# Patient Record
Sex: Female | Born: 1955 | Race: White | Hispanic: No | Marital: Married | Smoking: Former smoker
Health system: Southern US, Community
[De-identification: ages and names within clinical notes are randomized; demographics above are authoritative.]

## PROBLEM LIST (undated history)

## (undated) DIAGNOSIS — F32A Depression, unspecified: Secondary | ICD-10-CM

## (undated) DIAGNOSIS — R1909 Other intra-abdominal and pelvic swelling, mass and lump: Secondary | ICD-10-CM

## (undated) HISTORY — DX: Other intra-abdominal and pelvic swelling, mass and lump: R19.09

## (undated) HISTORY — PX: HERNIA REPAIR: SHX51

---

## 1990-08-27 HISTORY — PX: TUBAL LIGATION: SHX77

## 1992-03-27 HISTORY — PX: BREAST SURGERY: SHX581

## 2002-04-18 ENCOUNTER — Emergency Department (HOSPITAL_COMMUNITY): Admission: EM | Admit: 2002-04-18 | Discharge: 2002-04-19 | Payer: Self-pay | Admitting: *Deleted

## 2002-04-19 ENCOUNTER — Encounter: Payer: Self-pay | Admitting: Emergency Medicine

## 2008-03-07 ENCOUNTER — Encounter: Payer: Self-pay | Admitting: Family Medicine

## 2008-09-28 ENCOUNTER — Encounter (INDEPENDENT_AMBULATORY_CARE_PROVIDER_SITE_OTHER): Payer: Self-pay | Admitting: *Deleted

## 2008-10-10 ENCOUNTER — Ambulatory Visit: Payer: Self-pay | Admitting: Family Medicine

## 2008-10-10 DIAGNOSIS — F329 Major depressive disorder, single episode, unspecified: Secondary | ICD-10-CM

## 2008-10-10 DIAGNOSIS — F419 Anxiety disorder, unspecified: Secondary | ICD-10-CM

## 2008-10-10 DIAGNOSIS — M810 Age-related osteoporosis without current pathological fracture: Secondary | ICD-10-CM

## 2008-11-01 ENCOUNTER — Ambulatory Visit: Payer: Self-pay | Admitting: Family Medicine

## 2008-11-01 ENCOUNTER — Other Ambulatory Visit: Admission: RE | Admit: 2008-11-01 | Discharge: 2008-11-01 | Payer: Self-pay | Admitting: Family Medicine

## 2008-11-01 ENCOUNTER — Encounter: Payer: Self-pay | Admitting: Family Medicine

## 2008-11-01 DIAGNOSIS — R1909 Other intra-abdominal and pelvic swelling, mass and lump: Secondary | ICD-10-CM

## 2008-11-01 DIAGNOSIS — I8390 Asymptomatic varicose veins of unspecified lower extremity: Secondary | ICD-10-CM | POA: Insufficient documentation

## 2008-11-02 ENCOUNTER — Telehealth (INDEPENDENT_AMBULATORY_CARE_PROVIDER_SITE_OTHER): Payer: Self-pay | Admitting: *Deleted

## 2008-11-02 ENCOUNTER — Encounter (INDEPENDENT_AMBULATORY_CARE_PROVIDER_SITE_OTHER): Payer: Self-pay | Admitting: *Deleted

## 2008-11-02 ENCOUNTER — Encounter: Admission: RE | Admit: 2008-11-02 | Discharge: 2008-11-02 | Payer: Self-pay | Admitting: Family Medicine

## 2008-11-02 LAB — CONVERTED CEMR LAB
BUN: 7 mg/dL (ref 6–23)
Basophils Absolute: 0 10*3/uL (ref 0.0–0.1)
Bilirubin, Direct: 0 mg/dL (ref 0.0–0.3)
Cholesterol: 213 mg/dL — ABNORMAL HIGH (ref 0–200)
Creatinine, Ser: 0.8 mg/dL (ref 0.4–1.2)
Eosinophils Relative: 2.6 % (ref 0.0–5.0)
GFR calc non Af Amer: 79.96 mL/min (ref >60)
Glucose, Bld: 98 mg/dL (ref 70–99)
HCT: 40.8 % (ref 36.0–46.0)
HDL: 70.4 mg/dL (ref >39.00)
Lymphs Abs: 1.6 10*3/uL (ref 0.7–4.0)
MCV: 94.4 fL (ref 78.0–100.0)
Monocytes Absolute: 0.3 10*3/uL (ref 0.1–1.0)
Monocytes Relative: 6.6 % (ref 3.0–12.0)
Neutrophils Relative %: 58.6 % (ref 43.0–77.0)
Platelets: 229 10*3/uL (ref 150.0–400.0)
Potassium: 4 meq/L (ref 3.5–5.1)
RDW: 12.3 % (ref 11.5–14.6)
TSH: 2.88 microintl units/mL (ref 0.35–5.50)
Total Bilirubin: 0.7 mg/dL (ref 0.3–1.2)
Total CHOL/HDL Ratio: 3
VLDL: 12.8 mg/dL (ref 0.0–40.0)
WBC: 5 10*3/uL (ref 4.5–10.5)

## 2008-11-05 ENCOUNTER — Encounter (INDEPENDENT_AMBULATORY_CARE_PROVIDER_SITE_OTHER): Payer: Self-pay | Admitting: *Deleted

## 2008-11-09 ENCOUNTER — Encounter: Admission: RE | Admit: 2008-11-09 | Discharge: 2008-11-09 | Payer: Self-pay | Admitting: Family Medicine

## 2008-11-14 ENCOUNTER — Ambulatory Visit: Payer: Self-pay | Admitting: Vascular Surgery

## 2008-11-15 ENCOUNTER — Telehealth (INDEPENDENT_AMBULATORY_CARE_PROVIDER_SITE_OTHER): Payer: Self-pay | Admitting: *Deleted

## 2008-11-23 ENCOUNTER — Encounter (INDEPENDENT_AMBULATORY_CARE_PROVIDER_SITE_OTHER): Payer: Self-pay | Admitting: *Deleted

## 2008-11-26 ENCOUNTER — Ambulatory Visit: Payer: Self-pay | Admitting: Gastroenterology

## 2008-12-10 ENCOUNTER — Ambulatory Visit: Payer: Self-pay | Admitting: Gastroenterology

## 2008-12-10 LAB — HM COLONOSCOPY

## 2009-03-27 ENCOUNTER — Telehealth (INDEPENDENT_AMBULATORY_CARE_PROVIDER_SITE_OTHER): Payer: Self-pay | Admitting: *Deleted

## 2009-06-18 ENCOUNTER — Ambulatory Visit: Payer: Self-pay | Admitting: Family

## 2009-06-18 DIAGNOSIS — R233 Spontaneous ecchymoses: Secondary | ICD-10-CM

## 2009-06-18 DIAGNOSIS — M545 Low back pain: Secondary | ICD-10-CM

## 2009-06-18 LAB — CONVERTED CEMR LAB
Basophils Absolute: 0.1 10*3/uL (ref 0.0–0.1)
Eosinophils Absolute: 0.1 10*3/uL (ref 0.0–0.7)
Lymphocytes Relative: 28.1 % (ref 12.0–46.0)
MCHC: 33.9 g/dL (ref 30.0–36.0)
MCV: 97.2 fL (ref 78.0–100.0)
Monocytes Absolute: 0.4 10*3/uL (ref 0.1–1.0)
Neutrophils Relative %: 60.6 % (ref 43.0–77.0)
Platelets: 205 10*3/uL (ref 150.0–400.0)
RDW: 11.9 % (ref 11.5–14.6)

## 2009-06-21 ENCOUNTER — Encounter: Payer: Self-pay | Admitting: Family

## 2009-07-10 ENCOUNTER — Telehealth (INDEPENDENT_AMBULATORY_CARE_PROVIDER_SITE_OTHER): Payer: Self-pay | Admitting: *Deleted

## 2010-05-01 ENCOUNTER — Telehealth (INDEPENDENT_AMBULATORY_CARE_PROVIDER_SITE_OTHER): Payer: Self-pay | Admitting: *Deleted

## 2010-08-26 NOTE — Progress Notes (Signed)
Summary: BUPROPION REFILL   Phone Note Refill Request Message from:  Fax from Pharmacy on May 01, 2010 9:29 AM  Refills Requested: Medication #1:  BUPROPION HCL 150 MG XR12H-TAB take one tablet two times a day [facility] fax [phone]  Initial call taken by: Okey Regal [place],  May 01, 2010 9:30 AM    New/Updated Medications: BUPROPION HCL 150 MG XR12H-TAB (BUPROPION HCL) take one tablet two times a day- OFFICE DUE FOR FURTHER REFILLS Prescriptions: BUPROPION HCL 150 MG XR12H-TAB (BUPROPION HCL) take one tablet two times a day- OFFICE DUE FOR FURTHER REFILLS  #30 x 0   Entered by:   Doristine Devoid CMA   Authorized by:   Neena Rhymes MD   Signed by:   Doristine Devoid CMA on 05/01/2010   Method used:   Electronically to        [facility] [address] (retail)       [phone]       [place], [place]  [address]       Ph: [phone]       Fax: [phone]   RxID:   [id number]

## 2010-12-09 NOTE — Consult Note (Signed)
NEW PATIENT CONSULTATION   Proctor, Alexa L  DOB:  1955-11-11                                       11/14/2008  ZOXWR#:[id number]   Patient presents today for evaluation of a heavy sensation in both lower  extremities.  She reports that this has been somewhat progressive over  time and has occurred more so with prolonged standing and prolonged  sitting.  She describes this as an aching sensation in both legs, mostly  from her knees distally.  She is not having any history of deep venous  thrombosis or superficial thrombophlebitis.  She did report some  varicosities in her right medial proximal thigh with pregnancy but none  since.  She currently does not have any varicose veins.  She elevates  her legs when possible and does get some relief from this.   Past medical history is significant for osteoporosis and depression.   She has a history of hypertension in her father.   SOCIAL HISTORY:  She is married with 2 children.  She works as a  Futures trader.  She does smoke less than 1 pack of cigarettes per day.  Does  not drink alcohol on a regular basis.   REVIEW OF SYSTEMS:  Her weight is 103 pounds.  She is 5 feet 2 inches  tall.   MEDICATIONS:  Wellbutrin.   ALLERGIES:  Sulfa.   PHYSICAL EXAMINATION:  Blood pressure is 97/68.  Heart rate 66.  Respirations 18.  A well-developed, thin white female in no acute  distress.  Her radial and dorsalis pedis pulses are 2+ bilaterally.  She  does have mottling on her legs bilaterally in a cool room.  She does  have spider vein telangiectasias on her ankles onto her foot.  She does  not have any varicose veins.   She underwent screening venous duplex by me, and this does show a normal-  sized saphenous vein with no evidence of valvular incompetence.  I  discussed this at length with the patient.  I do not feel that she has  any evidence of arterial or venous pathology that would explain her  discomfort.  I explained  the spider vein telangiectasia could be treated  for cosmetic concerns with sclerotherapy if she desired, which she does  not.  She was reassured with this discussion and will see [place] again on an  as-needed basis.   Larina Earthly, M.D.  Electronically Signed   TFE/MEDQ  D:  11/14/2008  T:  11/15/2008  Job:  [id number]   cc:   Neena Rhymes, M.D.

## 2011-02-20 ENCOUNTER — Encounter: Payer: Self-pay | Admitting: Family Medicine

## 2011-02-20 ENCOUNTER — Ambulatory Visit (INDEPENDENT_AMBULATORY_CARE_PROVIDER_SITE_OTHER): Payer: BC Managed Care – PPO | Admitting: Family Medicine

## 2011-02-20 VITALS — BP 94/60 | Temp 99.6°F | Wt 99.0 lb

## 2011-02-20 DIAGNOSIS — M545 Low back pain: Secondary | ICD-10-CM

## 2011-02-20 MED ORDER — DICLOFENAC SODIUM ER 100 MG PO TB24: 100.0000 mg | ORAL_TABLET | Freq: Every day | ORAL | Status: DC

## 2011-02-20 MED ORDER — CYCLOBENZAPRINE HCL 10 MG PO TABS: 10.0000 mg | ORAL_TABLET | Freq: Three times a day (TID) | ORAL | Status: DC | PRN

## 2011-02-20 NOTE — Progress Notes (Signed)
  Subjective:    Patient ID: Alexa Proctor, female    DOB: [birth year], 55 y.o.   MRN: [id number]  HPI Back pain- sxs started prior to Christmas.  Pain is just left of thoracic spine.  Will radiate around the sides and 'through me' and cause nausea when pain gets severe.  Pain improved w/ 2 weeks of rest.  Once she resumed sewing 'hunching over the sewing table' pain returned.  Over the last 2 months pain has increased and it takes less activity to cause pain.  Pain improves w/ tylenol/advil/sleep.   Review of Systems For ROS see HPI     Objective:   Physical Exam  Constitutional: She is oriented to person, place, and time. She appears well-developed and well-nourished. No distress.  Neck: Normal range of motion. Neck supple.  Musculoskeletal: Normal range of motion. She exhibits tenderness (thoracic paraspinal tenderness on L). She exhibits no edema.  Neurological: She is alert and oriented to person, place, and time. She has normal reflexes. No cranial nerve deficit. Coordination normal.          Assessment & Plan:   No problem-specific assessment & plan notes found for this encounter.

## 2011-02-20 NOTE — Patient Instructions (Signed)
Please schedule your complete physical at your convenience- do not eat before this appt Go to the [facility] on [address] to get your xray- we'll call you with your results Someone will call you with your physical therapy appt Start the Flexeril nightly as needed for pain Take the Voltaren regularly for the next 10 days- take w/ food Call with any questions or concerns Hang in there!

## 2011-02-22 NOTE — Assessment & Plan Note (Signed)
Pt's back pain is now thoracic but it is muscular in nature.  No TTP over spine, + paraspinal spasm on L.  Start scheduled NSAIDs, muscle relaxer prn, refer to PT for core and back strengthening.  Reviewed supportive care and red flags that should prompt return.  Pt expressed understanding and is in agreement w/ plan.

## 2011-02-23 ENCOUNTER — Ambulatory Visit (HOSPITAL_BASED_OUTPATIENT_CLINIC_OR_DEPARTMENT_OTHER)
Admission: RE | Admit: 2011-02-23 | Discharge: 2011-02-23 | Disposition: A | Discharge status: Home / Self Care | Payer: BC Managed Care – PPO | Source: Ambulatory Visit | Attending: Family Medicine | Admitting: Family Medicine

## 2011-02-23 DIAGNOSIS — M545 Low back pain: Secondary | ICD-10-CM

## 2011-02-23 DIAGNOSIS — M546 Pain in thoracic spine: Secondary | ICD-10-CM | POA: Insufficient documentation

## 2011-02-24 ENCOUNTER — Telehealth: Payer: Self-pay

## 2011-02-24 NOTE — Telephone Encounter (Signed)
Results mailed 

## 2011-02-24 NOTE — Telephone Encounter (Signed)
Message copied by Beverely Low on Tue Feb 24, 2011  8:26 AM ------      Message from: Sheliah Hatch      Created: Tue Feb 24, 2011  7:58 AM       Normal xray

## 2011-04-13 ENCOUNTER — Ambulatory Visit (INDEPENDENT_AMBULATORY_CARE_PROVIDER_SITE_OTHER): Payer: BC Managed Care – PPO | Admitting: Family Medicine

## 2011-04-13 ENCOUNTER — Encounter: Payer: Self-pay | Admitting: Family Medicine

## 2011-04-13 ENCOUNTER — Other Ambulatory Visit (HOSPITAL_COMMUNITY)
Admission: RE | Admit: 2011-04-13 | Discharge: 2011-04-13 | Disposition: A | Discharge status: Home / Self Care | Payer: BC Managed Care – PPO | Source: Ambulatory Visit | Attending: Family Medicine | Admitting: Family Medicine

## 2011-04-13 DIAGNOSIS — G2581 Restless legs syndrome: Secondary | ICD-10-CM | POA: Insufficient documentation

## 2011-04-13 DIAGNOSIS — Z01419 Encounter for gynecological examination (general) (routine) without abnormal findings: Secondary | ICD-10-CM | POA: Insufficient documentation

## 2011-04-13 DIAGNOSIS — Z1231 Encounter for screening mammogram for malignant neoplasm of breast: Secondary | ICD-10-CM | POA: Insufficient documentation

## 2011-04-13 DIAGNOSIS — M810 Age-related osteoporosis without current pathological fracture: Secondary | ICD-10-CM

## 2011-04-13 DIAGNOSIS — Z124 Encounter for screening for malignant neoplasm of cervix: Secondary | ICD-10-CM

## 2011-04-13 DIAGNOSIS — F329 Major depressive disorder, single episode, unspecified: Secondary | ICD-10-CM

## 2011-04-13 DIAGNOSIS — Z0000 Encounter for general adult medical examination without abnormal findings: Secondary | ICD-10-CM | POA: Insufficient documentation

## 2011-04-13 LAB — HEPATIC FUNCTION PANEL: Total Bilirubin: 0.8 mg/dL (ref 0.3–1.2)

## 2011-04-13 LAB — LIPID PANEL
Cholesterol: 189 mg/dL (ref 0–200)
HDL: 76.1 mg/dL (ref >39.00)
Triglycerides: 28 mg/dL (ref 0.0–149.0)
VLDL: 5.6 mg/dL (ref 0.0–40.0)

## 2011-04-13 LAB — CBC WITH DIFFERENTIAL/PLATELET
Basophils Relative: 1 % (ref 0.0–3.0)
Eosinophils Absolute: 0.1 10*3/uL (ref 0.0–0.7)
Hemoglobin: 14.4 g/dL (ref 12.0–15.0)
MCHC: 33.4 g/dL (ref 30.0–36.0)
MCV: 96.5 fl (ref 78.0–100.0)
Monocytes Absolute: 0.3 10*3/uL (ref 0.1–1.0)
Neutro Abs: 4.4 10*3/uL (ref 1.4–7.7)
RBC: 4.49 Mil/uL (ref 3.87–5.11)
RDW: 13.2 % (ref 11.5–14.6)

## 2011-04-13 LAB — BASIC METABOLIC PANEL
BUN: 9 mg/dL (ref 6–23)
Calcium: 9.1 mg/dL (ref 8.4–10.5)
Creatinine, Ser: 0.7 mg/dL (ref 0.4–1.2)
GFR: 92.42 mL/min (ref >60.00)

## 2011-04-13 LAB — TSH: TSH: 2.69 u[IU]/mL (ref 0.35–5.50)

## 2011-04-13 MED ORDER — BUPROPION HCL ER (XL) 150 MG PO TB24: 150.0000 mg | ORAL_TABLET | ORAL | Status: DC

## 2011-04-13 MED ORDER — CLONAZEPAM 1 MG PO TABS: 1.0000 mg | ORAL_TABLET | Freq: Every evening | ORAL | Status: DC | PRN

## 2011-04-13 NOTE — Progress Notes (Signed)
  Subjective:    Patient ID: Alexa Proctor, female    DOB: 01/27/1956, 55 y.o.   MRN: [id number]  HPI CPE- UTD on colonoscopy.  Due for DEXA, PAP, mammo.  ? Restless leg- reports she is unable to get comfortable at night.  Has urge to move legs, feels like the muscles are 'strained', 'unable to relax', w/ 'burning'.  sxs will come and go.   Review of Systems Patient reports no vision/ hearing changes, adenopathy,fever, weight change,  persistant/recurrent hoarseness , swallowing issues, chest pain, palpitations, edema, persistant/recurrent cough, hemoptysis, dyspnea (rest/exertional/paroxysmal nocturnal), gastrointestinal bleeding (melena, rectal bleeding), abdominal pain, significant heartburn, bowel changes, GU symptoms (dysuria, hematuria, incontinence), Gyn symptoms (abnormal  bleeding, pain),  syncope, focal weakness, memory loss, numbness & tingling, skin/hair/nail changes, abnormal bruising or bleeding, anxiety, or depression.     Objective:   Physical Exam  General Appearance:    Alert, cooperative, no distress, appears stated age  Head:    Normocephalic, without obvious abnormality, atraumatic  Eyes:    PERRL, conjunctiva/corneas clear, EOM's intact, fundi    benign, both eyes  Ears:    Normal TM's and external ear canals, both ears  Nose:   Nares normal, septum midline, mucosa normal, no drainage    or sinus tenderness  Throat:   Lips, mucosa, and tongue normal; teeth and gums normal  Neck:   Supple, symmetrical, trachea midline, no adenopathy;    Thyroid: no enlargement/tenderness/nodules  Back:     Symmetric, no curvature, ROM normal, no CVA tenderness  Lungs:     Clear to auscultation bilaterally, respirations unlabored  Chest Wall:    No tenderness or deformity   Heart:    Regular rate and rhythm, S1 and S2 normal, no murmur, rub   or gallop  Breast Exam:    No tenderness, masses, or nipple abnormality  Abdomen:     Soft, non-tender, bowel sounds active all four  quadrants,    no masses, no organomegaly  Genitalia:    External genitalia normal, cervix normal in appearance, no CMT, uterus in normal size and position, adnexa w/out mass or tenderness, mucosa pink and moist, no lesions or discharge present  Rectal:    Normal external appearance  Extremities:   Extremities normal, atraumatic, no cyanosis or edema  Pulses:   2+ and symmetric all extremities  Skin:   Skin color, texture, turgor normal, no rashes or lesions  Lymph nodes:   Cervical, supraclavicular, and axillary nodes normal  Neurologic:   CNII-XII intact, normal strength, sensation and reflexes    throughout          Assessment & Plan:

## 2011-04-13 NOTE — Patient Instructions (Signed)
Follow up in 1 month to recheck mood and leg symptoms We'll notify you of your lab results Your exam looks great!  Keep up the good work! STOP SMOKING! Call with any questions or concerns Hang in there!

## 2011-04-14 ENCOUNTER — Telehealth: Payer: Self-pay

## 2011-04-14 ENCOUNTER — Encounter: Payer: Self-pay | Admitting: Family Medicine

## 2011-04-14 NOTE — Assessment & Plan Note (Signed)
Pt's description of sxs is suspicious for RLS- start low dose Klonopin nightly and see if sxs improve.

## 2011-04-14 NOTE — Assessment & Plan Note (Signed)
Has not taken meds as previously prescribed.  Due for DEXA- referral made

## 2011-04-14 NOTE — Assessment & Plan Note (Signed)
Pt's PE WNL.  Check labs.  Get mammo and dexa.  UTD on colonoscopy.  Encouraged smoking cessation.  Check labs.  Anticipatory guidance provided.  EKG done as baseline- short PR interval but nothing acute.

## 2011-04-14 NOTE — Assessment & Plan Note (Signed)
Pt due for mammo- referral made

## 2011-04-14 NOTE — Telephone Encounter (Signed)
Labs mailed

## 2011-04-14 NOTE — Assessment & Plan Note (Signed)
Pap collected. 

## 2011-04-14 NOTE — Telephone Encounter (Signed)
Message copied by Beverely Low on Tue Apr 14, 2011  3:32 PM ------      Message from: Sheliah Hatch      Created: Tue Apr 14, 2011  8:04 AM       Labs look great!  Keep up the good work!

## 2011-04-14 NOTE — Assessment & Plan Note (Signed)
Would like to restart Wellbutrin to help w/ both depression and possible smoking cessation.  Will follow.

## 2011-04-15 LAB — VITAMIN D 1,25 DIHYDROXY: Vitamin D3 1, 25 (OH)2: 49 pg/mL

## 2011-04-15 NOTE — Progress Notes (Signed)
Quick Note:  Lab mailed ______ 

## 2011-04-28 ENCOUNTER — Ambulatory Visit
Admission: RE | Admit: 2011-04-28 | Discharge: 2011-04-28 | Disposition: A | Discharge status: Home / Self Care | Payer: BC Managed Care – PPO | Source: Ambulatory Visit | Attending: Family Medicine | Admitting: Family Medicine

## 2011-04-28 DIAGNOSIS — Z1231 Encounter for screening mammogram for malignant neoplasm of breast: Secondary | ICD-10-CM

## 2011-04-28 DIAGNOSIS — M810 Age-related osteoporosis without current pathological fracture: Secondary | ICD-10-CM

## 2011-04-29 ENCOUNTER — Telehealth: Payer: Self-pay

## 2011-04-29 MED ORDER — ALENDRONATE SODIUM 70 MG PO TABS: 70.0000 mg | ORAL_TABLET | ORAL | Status: DC

## 2011-04-29 NOTE — Telephone Encounter (Signed)
Message copied by Beverely Low on Wed Apr 29, 2011  5:03 PM ------      Message from: Sheliah Hatch      Created: Wed Apr 29, 2011  9:31 AM       Remains osteoporotic.  Really needs to start a medication to protect her bones.  Would recommend Fosamax 75mg  weekly in addition to Calcium and Vit D

## 2011-04-29 NOTE — Telephone Encounter (Signed)
Pt aware and Rx sent.  

## 2011-05-05 ENCOUNTER — Encounter: Payer: Self-pay | Admitting: Family Medicine

## 2011-05-14 ENCOUNTER — Ambulatory Visit: Payer: BC Managed Care – PPO | Admitting: Family Medicine

## 2011-05-15 ENCOUNTER — Encounter: Payer: Self-pay | Admitting: Family Medicine

## 2011-05-15 ENCOUNTER — Ambulatory Visit (INDEPENDENT_AMBULATORY_CARE_PROVIDER_SITE_OTHER): Payer: BC Managed Care – PPO | Admitting: Family Medicine

## 2011-05-15 VITALS — BP 115/65 | HR 72 | Temp 98.9°F | Ht 62.75 in | Wt 97.0 lb

## 2011-05-15 DIAGNOSIS — F329 Major depressive disorder, single episode, unspecified: Secondary | ICD-10-CM

## 2011-05-15 DIAGNOSIS — F3289 Other specified depressive episodes: Secondary | ICD-10-CM

## 2011-05-15 DIAGNOSIS — G2581 Restless legs syndrome: Secondary | ICD-10-CM

## 2011-05-15 MED ORDER — CLONAZEPAM 1 MG PO TABS: 1.0000 mg | ORAL_TABLET | Freq: Every evening | ORAL | Status: DC | PRN

## 2011-05-15 NOTE — Progress Notes (Signed)
  Subjective:    Patient ID: Alexa Proctor, female    DOB: 1955/09/17, 55 y.o.   MRN: [id number]  HPI RLS- reports sxs have completely resolved since starting Klonopin.  Is now taking 1/2 tabs b/c of AM hangover.  Sleeping great.  Taking nightly.  Depression- has not restarted the Wellbutrin b/c she felt sxs were improving as her sleep improved.   Review of Systems For ROS see HPI     Objective:   Physical Exam  Vitals reviewed. Constitutional: She appears well-developed and well-nourished. No distress.  Psychiatric: She has a normal mood and affect. Her behavior is normal. Judgment and thought content normal.          Assessment & Plan:

## 2011-05-15 NOTE — Patient Instructions (Signed)
Continue the Klonopin nightly Hold on the Wellbutrin- you may not need it since your sleep has improved so much! Call with any questions or concerns Keep up the good work!!

## 2011-05-17 NOTE — Assessment & Plan Note (Signed)
sxs have completely resolved since starting Klonopin.  Will continue meds.  Pt to take 1/2 tab most nights but has whole tab available as needed.  Pt expressed understanding and is in agreement w/ plan.

## 2011-05-17 NOTE — Assessment & Plan Note (Signed)
Pt reports sxs have dramatically improved since sleep has improved.  Has not restarted Wellbutrin.  Will hold on this for now.

## 2011-11-30 ENCOUNTER — Ambulatory Visit (INDEPENDENT_AMBULATORY_CARE_PROVIDER_SITE_OTHER): Payer: BC Managed Care – PPO | Admitting: *Deleted

## 2011-11-30 ENCOUNTER — Other Ambulatory Visit: Payer: Self-pay | Admitting: Family Medicine

## 2011-11-30 DIAGNOSIS — Z23 Encounter for immunization: Secondary | ICD-10-CM

## 2011-11-30 DIAGNOSIS — F3289 Other specified depressive episodes: Secondary | ICD-10-CM

## 2011-11-30 DIAGNOSIS — F329 Major depressive disorder, single episode, unspecified: Secondary | ICD-10-CM

## 2011-11-30 DIAGNOSIS — G2581 Restless legs syndrome: Secondary | ICD-10-CM

## 2011-11-30 NOTE — Telephone Encounter (Signed)
refill bupropion HCL XL 150mg  tablet Qty 30 Take one tablet by mouth every morning Last filled 4.5.13  Last OV 10.19.12  &   Clonazepam 1MG  Tablet  Take one tablet at bedtime as needed for Anxiety

## 2011-12-01 MED ORDER — CLONAZEPAM 1 MG PO TABS: 1.0000 mg | ORAL_TABLET | Freq: Every evening | ORAL | Status: DC | PRN

## 2011-12-01 MED ORDER — BUPROPION HCL ER (XL) 150 MG PO TB24: 150.0000 mg | ORAL_TABLET | ORAL | Status: DC

## 2011-12-01 NOTE — Telephone Encounter (Signed)
Ok for #30 w/ 3 on each

## 2011-12-01 NOTE — Telephone Encounter (Signed)
rx sent to pharmacy by e-script for wellbutrin and manually faxed the Klonopin

## 2011-12-01 NOTE — Telephone Encounter (Signed)
Last OV and refill 05-15-11 #30 with 3 refills for Klonopin , refill for Wellbutrin on 04-13-11 #30 with 2 refills

## 2011-12-02 LAB — TB SKIN TEST
Induration: 0
TB Skin Test: NEGATIVE mm

## 2011-12-31 ENCOUNTER — Ambulatory Visit (INDEPENDENT_AMBULATORY_CARE_PROVIDER_SITE_OTHER): Payer: BC Managed Care – PPO | Admitting: Family Medicine

## 2011-12-31 ENCOUNTER — Encounter: Payer: Self-pay | Admitting: Family Medicine

## 2011-12-31 VITALS — BP 94/68 | HR 71 | Temp 99.1°F | Wt 93.6 lb

## 2011-12-31 DIAGNOSIS — R1909 Other intra-abdominal and pelvic swelling, mass and lump: Secondary | ICD-10-CM

## 2011-12-31 NOTE — Assessment & Plan Note (Addendum)
Enlarging.  Will refer to general surgery based on enlargement and pain.  Pt to take tylenol/NSAIDs prn for pain, heating pad prn.  Reviewed supportive care and red flags that should prompt return.  Pt expressed understanding and is in agreement w/ plan.

## 2011-12-31 NOTE — Progress Notes (Signed)
  Subjective:    Patient ID: Alexa Proctor, female    DOB: 1955-09-20, 56 y.o.   MRN: [id number]  HPI R sided groin cyst- has had 2 sets of imaging confirming a cyst but since Christmas has had 3-4 episodes of feeling that it was enlarged, firm, tight.  Now having intermittent pain described as a 'throbbing'.  No drainage from the area.  Present for at least 10 yrs.  Reports area is slowly enlarging.   Review of Systems For ROS see HPI     Objective:   Physical Exam  Vitals reviewed. Constitutional: She appears well-developed and well-nourished. No distress.  Musculoskeletal:       Right hip: She exhibits tenderness and swelling.       Legs: Lymphadenopathy:       Right: No inguinal adenopathy present.       Left: No inguinal adenopathy present.  Skin: Skin is warm and dry.          Assessment & Plan:

## 2011-12-31 NOTE — Patient Instructions (Signed)
We'll call you with your surgery appt Take Aleve and use a heating pad when this flares Call with any questions or concerns Hang in there!!!

## 2012-01-26 ENCOUNTER — Ambulatory Visit (INDEPENDENT_AMBULATORY_CARE_PROVIDER_SITE_OTHER): Payer: BC Managed Care – PPO | Admitting: Surgery

## 2012-01-26 ENCOUNTER — Encounter (INDEPENDENT_AMBULATORY_CARE_PROVIDER_SITE_OTHER): Payer: Self-pay | Admitting: Surgery

## 2012-01-26 VITALS — BP 102/64 | HR 60 | Temp 96.8°F | Resp 14 | Ht 63.0 in | Wt 94.0 lb

## 2012-01-26 DIAGNOSIS — R1909 Other intra-abdominal and pelvic swelling, mass and lump: Secondary | ICD-10-CM

## 2012-01-26 NOTE — Progress Notes (Signed)
Patient ID: Alexa Proctor, female   DOB: Jan 11, 1956, 56 y.o.   MRN: [id number]  Chief Complaint  Patient presents with  . Mass    Groin    HPI Alexa Proctor is a 56 y.o. female.   HPIPatient sent at the request of Dr.Tabori do to  Mass in right groin. It has been present for roughly 7 years. It is getting larger and causing mild to moderate discomfort. Ultrasound was done in 2010 which showed a simple cyst. She would like to have this removed.  Past Medical History  Diagnosis Date  . Osteoporosis   . Groin mass in female     Past Surgical History  Procedure Date  . Tubal ligation 08/1990  . Breast surgery 03/1992    implants    Family History  Problem Relation Age of Onset  . Cancer Mother     breast  . Hypertension Father   . Cancer Father     pancreatic  . Cancer Sister     colon, lung    Social History History  Substance Use Topics  . Smoking status: Current Everyday Smoker -- 0.5 packs/day    Types: Cigarettes  . Smokeless tobacco: Not on file  . Alcohol Use: Yes    Allergies  Allergen Reactions  . Soma (Carisoprodol)     Made patient feel "loopy, weird"  . Sulfonamide Derivatives     Per patient happened in childhood, does not know reaction.    Current Outpatient Prescriptions  Medication Sig Dispense Refill  . buPROPion (WELLBUTRIN XL) 150 MG 24 hr tablet Take 150 mg by mouth daily.      . clonazePAM (KLONOPIN) 1 MG tablet Take 1 tablet (1 mg total) by mouth at bedtime as needed for anxiety.  30 tablet  3    Review of Systems Review of Systems  Constitutional: Negative.   HENT: Negative.   Eyes: Negative.   Respiratory: Negative.   Cardiovascular: Negative.   Gastrointestinal: Negative.   Musculoskeletal: Negative.   Neurological: Negative.   Psychiatric/Behavioral: Negative.     Blood pressure 102/64, pulse 60, temperature 96.8 F (36 C), temperature source Temporal, resp. rate 14, height 5\' 3"  (1.6 m), weight 94 lb (42.638  kg).  Physical Exam Physical Exam  Constitutional: She is oriented to person, place, and time. She appears well-developed and well-nourished.  HENT:  Head: Normocephalic and atraumatic.  Eyes: EOM are normal. Pupils are equal, round, and reactive to light.  Neck: Normal range of motion. Neck supple.  Cardiovascular: Normal rate and regular rhythm.   Pulmonary/Chest: Effort normal and breath sounds normal.  Abdominal: Soft. Bowel sounds are normal.    Neurological: She is alert and oriented to person, place, and time.  Skin: Skin is warm and dry.  Psychiatric: She has a normal mood and affect. Her behavior is normal. Judgment and thought content normal.    Data Reviewed U/S   Right groin 2010  Assessment    Right groin mass    Plan    Recommend excision.  Risks,  Benefits,  And alternative   Therapy discussed.  This could also be a hernia and require repair.The risk of hernia repair include bleeding,  Infection,   Recurrence of the hernia,  Mesh use, chronic pain,  Organ injury,  Bowel injury,  Bladder injury,   nerve injury with numbness around the incision,  Death,  and worsening of preexisting  medical problems.  The alternatives to surgery have been discussed as well.Marland Kitchen  Long term expectations of both operative and non operative treatments have been discussed.   The patient agrees to proceed.       Twilla Khouri A. 01/26/2012, 3:22 PM

## 2012-01-26 NOTE — Patient Instructions (Signed)
You will be scheduled for right groin exploration and resection of cyst or repair of right inguinal hernia.

## 2012-03-21 ENCOUNTER — Other Ambulatory Visit (INDEPENDENT_AMBULATORY_CARE_PROVIDER_SITE_OTHER): Payer: Self-pay | Admitting: Surgery

## 2012-03-21 ENCOUNTER — Ambulatory Visit
Admission: RE | Admit: 2012-03-21 | Discharge: 2012-03-21 | Disposition: A | Discharge status: Home / Self Care | Payer: BC Managed Care – PPO | Source: Ambulatory Visit | Attending: Surgery | Admitting: Surgery

## 2012-03-21 DIAGNOSIS — Z01811 Encounter for preprocedural respiratory examination: Secondary | ICD-10-CM

## 2012-03-21 DIAGNOSIS — R1909 Other intra-abdominal and pelvic swelling, mass and lump: Secondary | ICD-10-CM

## 2012-03-23 ENCOUNTER — Other Ambulatory Visit: Payer: Self-pay | Admitting: Family Medicine

## 2012-03-23 NOTE — Telephone Encounter (Signed)
Called pt to clarify if she is still taking the wellbutrin per notation on last OV to follow up on wellbutrin noted 10-12 to use if need per klonopin may help instead, pt noted she is still taking, please send refill, sent via escribe

## 2012-03-24 DIAGNOSIS — IMO0001 Reserved for inherently not codable concepts without codable children: Secondary | ICD-10-CM

## 2012-03-24 HISTORY — PX: OTHER SURGICAL HISTORY: SHX169

## 2012-04-12 ENCOUNTER — Ambulatory Visit (INDEPENDENT_AMBULATORY_CARE_PROVIDER_SITE_OTHER): Payer: BC Managed Care – PPO | Admitting: Surgery

## 2012-04-12 ENCOUNTER — Encounter (INDEPENDENT_AMBULATORY_CARE_PROVIDER_SITE_OTHER): Payer: Self-pay | Admitting: Surgery

## 2012-04-12 VITALS — BP 106/72 | HR 72 | Temp 98.2°F | Ht 63.25 in | Wt 90.8 lb

## 2012-04-12 DIAGNOSIS — N898 Other specified noninflammatory disorders of vagina: Secondary | ICD-10-CM

## 2012-04-12 DIAGNOSIS — Z9889 Other specified postprocedural states: Secondary | ICD-10-CM

## 2012-04-12 MED ORDER — FLUCONAZOLE 100 MG PO TABS: 100.0000 mg | ORAL_TABLET | Freq: Every day | ORAL | Status: AC

## 2012-04-12 NOTE — Progress Notes (Signed)
Patient returns after repair right femoral hernia with mesh. She's doing fairly well but has intermittent whitish yellowish vaginal discharge. She's having minimal pelvic pain. There is no bloody discharge or significant pain.  Exam: Right groin incision well-healed without signs of infection or recurrence.  Vagina examined with lubrication and a chaperone in the room digitally. No evidence of blood or vaginal injury. No evidence of discharge.  Impression: Status post repair of right femoral hernia with postop vaginal discharge  Plan: Try Diflucan for 7 days 100 mg p.o. daily. If no better she will need to see her gynecologist. I can feel no complicating feature on exam to explain her discharge. Return as needed.

## 2012-04-12 NOTE — Patient Instructions (Signed)
Return if symptoms worsen.  If discharge no better,  Contact GYN.  Will try diflucan for discharge.

## 2012-06-07 ENCOUNTER — Other Ambulatory Visit: Payer: Self-pay | Admitting: Family Medicine

## 2012-06-07 DIAGNOSIS — G2581 Restless legs syndrome: Secondary | ICD-10-CM

## 2012-06-07 NOTE — Telephone Encounter (Signed)
Ok for #30, 3 refills 

## 2012-06-07 NOTE — Telephone Encounter (Signed)
Last OV 12-31-11, last filled 12-01-11 #30 3

## 2012-06-07 NOTE — Telephone Encounter (Signed)
refill ClonazePAM (Tab) 1 MG Take 1 tablet (1 mg total) by mouth at bedtime as needed for anxiety. #30 wt/3-refills last fill 9.5.13---last ov  6.6.13 acute

## 2012-06-08 MED ORDER — CLONAZEPAM 1 MG PO TABS: 1.0000 mg | ORAL_TABLET | Freq: Every evening | ORAL | Status: DC | PRN

## 2012-06-08 NOTE — Telephone Encounter (Signed)
Rx sent 

## 2012-06-14 ENCOUNTER — Telehealth (INDEPENDENT_AMBULATORY_CARE_PROVIDER_SITE_OTHER): Payer: Self-pay

## 2012-06-14 NOTE — Telephone Encounter (Signed)
Pt called with questions re: anesthesia charges from the [facility] and [facility]. I advised pt to contact [facility] because we will not have any information re: [facility] billing for anesthesia or there Control and instrumentation engineer. Pt states she understands and will call their billing office.

## 2012-08-03 ENCOUNTER — Ambulatory Visit (INDEPENDENT_AMBULATORY_CARE_PROVIDER_SITE_OTHER): Payer: BC Managed Care – PPO | Admitting: Family Medicine

## 2012-08-03 ENCOUNTER — Encounter: Payer: Self-pay | Admitting: Lab

## 2012-08-03 ENCOUNTER — Encounter: Payer: Self-pay | Admitting: Family Medicine

## 2012-08-03 VITALS — BP 96/70 | HR 67 | Temp 98.1°F | Ht 63.25 in | Wt 92.4 lb

## 2012-08-03 DIAGNOSIS — J329 Chronic sinusitis, unspecified: Secondary | ICD-10-CM | POA: Insufficient documentation

## 2012-08-03 MED ORDER — AMOXICILLIN 875 MG PO TABS: 875.0000 mg | ORAL_TABLET | Freq: Two times a day (BID) | ORAL | Status: DC

## 2012-08-03 NOTE — Progress Notes (Signed)
  Subjective:    Patient ID: Alexa Proctor, female    DOB: 1956-04-21, 57 y.o.   MRN: [id number]  HPI ? Sinus infection- pt used to get regular sinus infxns, has not had one recently.  sxs started 12/26.  Has been using decongestants, antihistamines, expectorants.  + facial pain/pressure.  Mild cough- loose due to expectorant use.  No ear pain.  + PND.  Initially had low grade temps x3 days.  Tm 100.6.  No known sick contacts.   Review of Systems For ROS see HPI     Objective:   Physical Exam  Vitals reviewed. Constitutional: She appears well-developed and well-nourished. No distress.  HENT:  Head: Normocephalic and atraumatic.  Right Ear: Tympanic membrane normal.  Left Ear: Tympanic membrane normal.  Nose: Mucosal edema and rhinorrhea present. Right sinus exhibits maxillary sinus tenderness and frontal sinus tenderness. Left sinus exhibits maxillary sinus tenderness and frontal sinus tenderness.  Mouth/Throat: Uvula is midline and mucous membranes are normal. Posterior oropharyngeal erythema present. No oropharyngeal exudate.  Eyes: Conjunctivae normal and EOM are normal. Pupils are equal, round, and reactive to light.  Neck: Normal range of motion. Neck supple.  Cardiovascular: Normal rate, regular rhythm and normal heart sounds.   Pulmonary/Chest: Effort normal and breath sounds normal. No respiratory distress. She has no wheezes.  Lymphadenopathy:    She has no cervical adenopathy.          Assessment & Plan:

## 2012-08-03 NOTE — Patient Instructions (Addendum)
Start the amoxicillin twice daily- take w/ food Drink plenty of fluids Mucinex DM for cough and congestion REST! Call with any questions or concerns Hang in there!!!

## 2012-08-04 NOTE — Assessment & Plan Note (Signed)
New.  Start abx.  Reviewed supportive care and red flags that should prompt return.  Pt expressed understanding and is in agreement w/ plan.  

## 2012-09-30 ENCOUNTER — Other Ambulatory Visit: Payer: Self-pay | Admitting: Family Medicine

## 2012-10-03 NOTE — Telephone Encounter (Signed)
Please advise on RF request.  No recent CPE and last RF:08-29-12.//AB/CMA

## 2012-10-04 NOTE — Telephone Encounter (Signed)
Ok for #30, 5 refills but please remind her to schedule CPE (she's overdue)

## 2012-10-05 ENCOUNTER — Other Ambulatory Visit: Payer: Self-pay | Admitting: *Deleted

## 2012-10-05 DIAGNOSIS — F329 Major depressive disorder, single episode, unspecified: Secondary | ICD-10-CM

## 2012-10-05 MED ORDER — BUPROPION HCL ER (XL) 150 MG PO TB24: 150.0000 mg | ORAL_TABLET | Freq: Every day | ORAL | Status: DC

## 2012-10-05 NOTE — Telephone Encounter (Signed)
Refill for Wellbutrin sent to [facility] in [place] pt notified.

## 2012-12-14 ENCOUNTER — Encounter: Payer: Self-pay | Admitting: Lab

## 2012-12-15 ENCOUNTER — Encounter: Payer: Self-pay | Admitting: Family Medicine

## 2012-12-15 ENCOUNTER — Ambulatory Visit (INDEPENDENT_AMBULATORY_CARE_PROVIDER_SITE_OTHER): Payer: BC Managed Care – PPO | Admitting: Family Medicine

## 2012-12-15 VITALS — BP 90/60 | HR 68 | Temp 98.3°F | Ht 63.25 in | Wt 95.0 lb

## 2012-12-15 DIAGNOSIS — Z01419 Encounter for gynecological examination (general) (routine) without abnormal findings: Secondary | ICD-10-CM

## 2012-12-15 DIAGNOSIS — G2581 Restless legs syndrome: Secondary | ICD-10-CM

## 2012-12-15 LAB — HEPATIC FUNCTION PANEL
ALT: 35 U/L (ref 0–35)
AST: 33 U/L (ref 0–37)
Albumin: 3.9 g/dL (ref 3.5–5.2)
Alkaline Phosphatase: 59 U/L (ref 39–117)
Bilirubin, Direct: 0.1 mg/dL (ref 0.0–0.3)
Total Bilirubin: 0.7 mg/dL (ref 0.3–1.2)
Total Protein: 6.7 g/dL (ref 6.0–8.3)

## 2012-12-15 LAB — BASIC METABOLIC PANEL
BUN: 9 mg/dL (ref 6–23)
CO2: 29 mEq/L (ref 19–32)
Calcium: 9 mg/dL (ref 8.4–10.5)
Chloride: 104 mEq/L (ref 96–112)
Creatinine, Ser: 0.8 mg/dL (ref 0.4–1.2)
GFR: 77.62 mL/min (ref >60.00)
Glucose, Bld: 99 mg/dL (ref 70–99)
Potassium: 3.9 mEq/L (ref 3.5–5.1)
Sodium: 139 mEq/L (ref 135–145)

## 2012-12-15 LAB — CBC WITH DIFFERENTIAL/PLATELET
Basophils Absolute: 0 10*3/uL (ref 0.0–0.1)
Basophils Relative: 0.4 % (ref 0.0–3.0)
Eosinophils Absolute: 0.1 10*3/uL (ref 0.0–0.7)
Eosinophils Relative: 1.2 % (ref 0.0–5.0)
HCT: 41.8 % (ref 36.0–46.0)
Hemoglobin: 14.3 g/dL (ref 12.0–15.0)
Lymphocytes Relative: 16.6 % (ref 12.0–46.0)
Lymphs Abs: 1.4 10*3/uL (ref 0.7–4.0)
MCHC: 34.2 g/dL (ref 30.0–36.0)
MCV: 94.6 fl (ref 78.0–100.0)
Monocytes Absolute: 0.4 10*3/uL (ref 0.1–1.0)
Monocytes Relative: 5 % (ref 3.0–12.0)
Neutro Abs: 6.5 10*3/uL (ref 1.4–7.7)
Neutrophils Relative %: 76.8 % (ref 43.0–77.0)
Platelets: 217 10*3/uL (ref 150.0–400.0)
RBC: 4.42 Mil/uL (ref 3.87–5.11)
RDW: 12.6 % (ref 11.5–14.6)
WBC: 8.5 10*3/uL (ref 4.5–10.5)

## 2012-12-15 LAB — LIPID PANEL
Cholesterol: 180 mg/dL (ref 0–200)
HDL: 76 mg/dL (ref >39.00)
LDL Cholesterol: 97 mg/dL (ref 0–99)
Total CHOL/HDL Ratio: 2
Triglycerides: 37 mg/dL (ref 0.0–149.0)
VLDL: 7.4 mg/dL (ref 0.0–40.0)

## 2012-12-15 MED ORDER — CLONAZEPAM 1 MG PO TABS: 1.0000 mg | ORAL_TABLET | Freq: Every evening | ORAL | Status: DC | PRN

## 2012-12-15 NOTE — Patient Instructions (Addendum)
Follow up in 1 year or as needed We'll notify you of your lab results and make any changes if needed Keep up the good work! Call and schedule your mammo! If you continue to have vaginal pressure- call me! Happy Memorial Day!!

## 2012-12-15 NOTE — Progress Notes (Signed)
  Subjective:    Patient ID: Alexa Proctor, female    DOB: [birth year], 57 y.o.   MRN: [id number]  HPI CPE- UTD on pap, colonoscopy.  Due for mammo/dexa.   Review of Systems Patient reports no vision/ hearing changes, adenopathy,fever, weight change,  persistant/recurrent hoarseness , swallowing issues, chest pain, palpitations, edema, persistant/recurrent cough, hemoptysis, dyspnea (rest/exertional/paroxysmal nocturnal), gastrointestinal bleeding (melena, rectal bleeding), abdominal pain, significant heartburn, bowel changes, GU symptoms (dysuria, hematuria, incontinence), Gyn symptoms (abnormal  bleeding, pain),  syncope, focal weakness, memory loss, numbness & tingling, skin/hair/nail changes, abnormal bruising or bleeding, anxiety, or depression.     Objective:   Physical Exam General Appearance:    Alert, cooperative, no distress, appears stated age  Head:    Normocephalic, without obvious abnormality, atraumatic  Eyes:    PERRL, conjunctiva/corneas clear, EOM's intact, fundi    benign, both eyes  Ears:    Normal TM's and external ear canals, both ears  Nose:   Nares normal, septum midline, mucosa normal, no drainage    or sinus tenderness  Throat:   Lips, mucosa, and tongue normal; teeth and gums normal  Neck:   Supple, symmetrical, trachea midline, no adenopathy;    Thyroid: no enlargement/tenderness/nodules  Back:     Symmetric, no curvature, ROM normal, no CVA tenderness  Lungs:     Clear to auscultation bilaterally, respirations unlabored  Chest Wall:    No tenderness or deformity   Heart:    Regular rate and rhythm, S1 and S2 normal, no murmur, rub   or gallop  Breast Exam:    Deferred to GYN  Abdomen:     Soft, non-tender, bowel sounds active all four quadrants,    no masses, no organomegaly  Genitalia:    Deferred to GYN  Rectal:    Extremities:   Extremities normal, atraumatic, no cyanosis or edema  Pulses:   2+ and symmetric all extremities  Skin:   Skin color,  texture, turgor normal, no rashes or lesions  Lymph nodes:   Cervical, supraclavicular, and axillary nodes normal  Neurologic:   CNII-XII intact, normal strength, sensation and reflexes    throughout          Assessment & Plan:

## 2012-12-15 NOTE — Assessment & Plan Note (Signed)
Pt's PE WNL.  UTD on pap, colonoscopy.  Pt encouraged to schedule mammo.  Check labs.  Anticipatory guidance provided.

## 2012-12-16 ENCOUNTER — Encounter: Payer: Self-pay | Admitting: *Deleted

## 2012-12-20 LAB — VITAMIN D 1,25 DIHYDROXY: Vitamin D2 1, 25 (OH)2: <8 pg/mL

## 2012-12-22 ENCOUNTER — Encounter: Payer: Self-pay | Admitting: General Practice

## 2012-12-27 ENCOUNTER — Encounter: Payer: Self-pay | Admitting: Family Medicine

## 2013-03-28 ENCOUNTER — Other Ambulatory Visit: Payer: Self-pay | Admitting: Family Medicine

## 2013-03-29 NOTE — Telephone Encounter (Signed)
Med filled.  

## 2013-08-18 ENCOUNTER — Other Ambulatory Visit: Payer: Self-pay | Admitting: Family Medicine

## 2013-08-18 NOTE — Telephone Encounter (Signed)
Med filled and faxed.  

## 2013-08-18 NOTE — Telephone Encounter (Signed)
Last OV 12-15-12 Med filled 12-15-12 #30 with 3

## 2013-08-31 ENCOUNTER — Other Ambulatory Visit: Payer: Self-pay | Admitting: Family Medicine

## 2013-08-31 NOTE — Telephone Encounter (Signed)
Last OV 12-15-12 Med filled #30 with 0 08-18-13  Low Risk, Due for screen

## 2013-09-05 ENCOUNTER — Other Ambulatory Visit: Payer: Self-pay | Admitting: Family Medicine

## 2013-09-06 ENCOUNTER — Other Ambulatory Visit: Payer: Self-pay | Admitting: Family Medicine

## 2013-09-06 NOTE — Telephone Encounter (Signed)
Denied med was filled on 08/31/13

## 2013-09-07 NOTE — Telephone Encounter (Signed)
Med filled.  

## 2013-09-19 ENCOUNTER — Other Ambulatory Visit: Payer: Self-pay | Admitting: Internal Medicine

## 2013-10-16 ENCOUNTER — Encounter: Payer: Self-pay | Admitting: Gastroenterology

## 2013-10-18 ENCOUNTER — Ambulatory Visit (INDEPENDENT_AMBULATORY_CARE_PROVIDER_SITE_OTHER): Payer: BC Managed Care – PPO | Admitting: Family Medicine

## 2013-10-18 ENCOUNTER — Telehealth: Payer: Self-pay | Admitting: Family Medicine

## 2013-10-18 ENCOUNTER — Encounter: Payer: Self-pay | Admitting: Family Medicine

## 2013-10-18 VITALS — BP 110/72 | HR 78 | Temp 98.2°F | Resp 16 | Wt 98.1 lb

## 2013-10-18 DIAGNOSIS — F3289 Other specified depressive episodes: Secondary | ICD-10-CM

## 2013-10-18 DIAGNOSIS — F329 Major depressive disorder, single episode, unspecified: Secondary | ICD-10-CM

## 2013-10-18 DIAGNOSIS — G2581 Restless legs syndrome: Secondary | ICD-10-CM

## 2013-10-18 MED ORDER — BUPROPION HCL ER (XL) 150 MG PO TB24: ORAL_TABLET | ORAL | Status: DC

## 2013-10-18 NOTE — Progress Notes (Signed)
   Subjective:    Patient ID: Alexa Proctor, female    DOB: May 18, 1956, 58 y.o.   MRN: [id number]  HPI Depression- chronic problem, currently on Wellbutrin 150mg  daily and klonopin prn.  Pt reports she continues to have situational anxiety.  Sleeping fairly well at night.   Review of Systems For ROS see HPI     Objective:   Physical Exam  Vitals reviewed. Constitutional: She is oriented to person, place, and time. She appears well-developed and well-nourished. No distress.  HENT:  Head: Normocephalic and atraumatic.  Neck: Normal range of motion. Neck supple. No thyromegaly present.  Cardiovascular: Normal rate, regular rhythm, normal heart sounds and intact distal pulses.   No murmur heard. Pulmonary/Chest: Effort normal and breath sounds normal. No respiratory distress. She has no wheezes. She has no rales.  Lymphadenopathy:    She has no cervical adenopathy.  Neurological: She is alert and oriented to person, place, and time. No cranial nerve deficit. Coordination normal.  Skin: Skin is warm and dry.  Psychiatric: She has a normal mood and affect. Her behavior is normal. Thought content normal.          Assessment & Plan:

## 2013-10-18 NOTE — Patient Instructions (Signed)
Schedule your complete physical for this summer Call with any questions or concerns Continue the Klonopin at night for restless leg/insomnia Continue the Wellbutrin daily Call with any questions or concerns Happy Spring!!

## 2013-10-18 NOTE — Progress Notes (Signed)
Pre visit review using our clinic review tool, if applicable. No additional management support is needed unless otherwise documented below in the visit note. 

## 2013-10-18 NOTE — Telephone Encounter (Signed)
Relevant patient education assigned to patient using Emmi. ° °

## 2013-10-22 NOTE — Assessment & Plan Note (Signed)
Chronic problem.  Adequate control on current med dose.  No changes.  Refills provided.

## 2013-10-22 NOTE — Assessment & Plan Note (Signed)
Continue Klonopin nightly as needed.  Will follow.

## 2014-02-02 ENCOUNTER — Other Ambulatory Visit: Payer: Self-pay | Admitting: Family Medicine

## 2014-02-02 NOTE — Telephone Encounter (Signed)
Med filled and faxed.  

## 2014-02-02 NOTE — Telephone Encounter (Signed)
Last OV 10-18-13 Med filled 08-31-13 #30 with 2  Low risk

## 2014-05-04 ENCOUNTER — Other Ambulatory Visit (HOSPITAL_COMMUNITY)
Admission: RE | Admit: 2014-05-04 | Discharge: 2014-05-04 | Disposition: A | Discharge status: Home / Self Care | Payer: 59 | Source: Ambulatory Visit | Attending: Family Medicine | Admitting: Family Medicine

## 2014-05-04 ENCOUNTER — Encounter: Payer: Self-pay | Admitting: Family Medicine

## 2014-05-04 ENCOUNTER — Ambulatory Visit (INDEPENDENT_AMBULATORY_CARE_PROVIDER_SITE_OTHER): Payer: 59 | Admitting: Family Medicine

## 2014-05-04 VITALS — BP 106/74 | HR 74 | Temp 98.3°F | Resp 16 | Ht 63.0 in | Wt 100.2 lb

## 2014-05-04 DIAGNOSIS — Z01419 Encounter for gynecological examination (general) (routine) without abnormal findings: Secondary | ICD-10-CM | POA: Insufficient documentation

## 2014-05-04 DIAGNOSIS — M810 Age-related osteoporosis without current pathological fracture: Secondary | ICD-10-CM | POA: Diagnosis not present

## 2014-05-04 DIAGNOSIS — Z1231 Encounter for screening mammogram for malignant neoplasm of breast: Secondary | ICD-10-CM

## 2014-05-04 DIAGNOSIS — Z1151 Encounter for screening for human papillomavirus (HPV): Secondary | ICD-10-CM | POA: Diagnosis present

## 2014-05-04 DIAGNOSIS — Z124 Encounter for screening for malignant neoplasm of cervix: Secondary | ICD-10-CM | POA: Diagnosis not present

## 2014-05-04 DIAGNOSIS — Z23 Encounter for immunization: Secondary | ICD-10-CM

## 2014-05-04 LAB — CBC WITH DIFFERENTIAL/PLATELET
BASOS ABS: 0 10*3/uL (ref 0.0–0.1)
BASOS PCT: 0.8 % (ref 0.0–3.0)
EOS ABS: 0.1 10*3/uL (ref 0.0–0.7)
Eosinophils Relative: 2 % (ref 0.0–5.0)
HCT: 41.5 % (ref 36.0–46.0)
Hemoglobin: 13.8 g/dL (ref 12.0–15.0)
LYMPHS PCT: 34.3 % (ref 12.0–46.0)
Lymphs Abs: 1.8 10*3/uL (ref 0.7–4.0)
MCHC: 33.2 g/dL (ref 30.0–36.0)
MCV: 95.8 fl (ref 78.0–100.0)
Monocytes Absolute: 0.3 10*3/uL (ref 0.1–1.0)
Monocytes Relative: 5.4 % (ref 3.0–12.0)
NEUTROS PCT: 57.5 % (ref 43.0–77.0)
Neutro Abs: 2.9 10*3/uL (ref 1.4–7.7)
Platelets: 229 10*3/uL (ref 150.0–400.0)
RBC: 4.33 Mil/uL (ref 3.87–5.11)
RDW: 12.7 % (ref 11.5–15.5)
WBC: 5.1 10*3/uL (ref 4.0–10.5)

## 2014-05-04 LAB — VITAMIN D 25 HYDROXY (VIT D DEFICIENCY, FRACTURES): VITD: 50.17 ng/mL (ref 30.00–100.00)

## 2014-05-04 LAB — HEPATIC FUNCTION PANEL
ALT: 35 U/L (ref 0–35)
AST: 38 U/L — ABNORMAL HIGH (ref 0–37)
Albumin: 3.7 g/dL (ref 3.5–5.2)
Alkaline Phosphatase: 52 U/L (ref 39–117)
Bilirubin, Direct: 0.1 mg/dL (ref 0.0–0.3)
TOTAL PROTEIN: 7.1 g/dL (ref 6.0–8.3)
Total Bilirubin: 0.7 mg/dL (ref 0.2–1.2)

## 2014-05-04 LAB — TSH: TSH: 1.59 u[IU]/mL (ref 0.35–4.50)

## 2014-05-04 LAB — BASIC METABOLIC PANEL
BUN: 8 mg/dL (ref 6–23)
CALCIUM: 9 mg/dL (ref 8.4–10.5)
CO2: 24 mEq/L (ref 19–32)
CREATININE: 0.8 mg/dL (ref 0.4–1.2)
Chloride: 106 mEq/L (ref 96–112)
GFR: 80.68 mL/min (ref >60.00)
GLUCOSE: 92 mg/dL (ref 70–99)
Potassium: 3.8 mEq/L (ref 3.5–5.1)
SODIUM: 138 meq/L (ref 135–145)

## 2014-05-04 LAB — LIPID PANEL
Cholesterol: 200 mg/dL (ref 0–200)
HDL: 66 mg/dL (ref >39.00)
LDL CALC: 127 mg/dL — AB (ref 0–99)
NONHDL: 134
Total CHOL/HDL Ratio: 3
Triglycerides: 34 mg/dL (ref 0.0–149.0)
VLDL: 6.8 mg/dL (ref 0.0–40.0)

## 2014-05-04 NOTE — Patient Instructions (Signed)
Follow up in 1 year or as needed We'll notify you of your lab results and make any changes if needed We'll call you with your bone density and mammo appts Keep up the good work on healthy diet and regular exercise Call with any questions or concerns Happy Fall!!!

## 2014-05-04 NOTE — Progress Notes (Signed)
   Subjective:    Patient ID: Alexa Proctor, female    DOB: [birth year], 58 y.o.   MRN: [id number]  HPI CPE- due for mammo, pap, DEXA.  UTD on colonoscopy.   Review of Systems Patient reports no vision/ hearing changes, adenopathy,fever, weight change,  persistant/recurrent hoarseness , swallowing issues, chest pain, palpitations, edema, persistant/recurrent cough, hemoptysis, dyspnea (rest/exertional/paroxysmal nocturnal), gastrointestinal bleeding (melena, rectal bleeding), abdominal pain, significant heartburn, bowel changes, GU symptoms (dysuria, hematuria, incontinence), Gyn symptoms (abnormal  bleeding, pain),  syncope, focal weakness, memory loss, numbness & tingling, skin/hair/nail changes, abnormal bruising or bleeding, anxiety, or depression.     Objective:   Physical Exam  General Appearance:    Alert, cooperative, no distress, appears stated age  Head:    Normocephalic, without obvious abnormality, atraumatic  Eyes:    PERRL, conjunctiva/corneas clear, EOM's intact, fundi    benign, both eyes  Ears:    Normal TM's and external ear canals, both ears  Nose:   Nares normal, septum midline, mucosa normal, no drainage    or sinus tenderness  Throat:   Lips, mucosa, and tongue normal; teeth and gums normal  Neck:   Supple, symmetrical, trachea midline, no adenopathy;    Thyroid: no enlargement/tenderness/nodules  Back:     Symmetric, no curvature, ROM normal, no CVA tenderness  Lungs:     Clear to auscultation bilaterally, respirations unlabored  Chest Wall:    No tenderness or deformity   Heart:    Regular rate and rhythm, S1 and S2 normal, no murmur, rub   or gallop  Breast Exam:    No tenderness, masses, or nipple abnormality  Abdomen:     Soft, non-tender, bowel sounds active all four quadrants,    no masses, no organomegaly  Genitalia:    External genitalia normal, cervix normal in appearance, no CMT, uterus in normal size and position, adnexa w/out mass or tenderness,  mucosa pink and moist, no lesions or discharge present  Rectal:    Normal external appearance  Extremities:   Extremities normal, atraumatic, no cyanosis or edema  Pulses:   2+ and symmetric all extremities  Skin:   Skin color, texture, turgor normal, no rashes or lesions  Lymph nodes:   Cervical, supraclavicular, and axillary nodes normal  Neurologic:   CNII-XII intact, normal strength, sensation and reflexes    throughout          Assessment & Plan:

## 2014-05-04 NOTE — Progress Notes (Signed)
Pre visit review using our clinic review tool, if applicable. No additional management support is needed unless otherwise documented below in the visit note. 

## 2014-05-04 NOTE — Assessment & Plan Note (Signed)
Pt's PE WNL.  UTD on colonoscopy.  Due for mammo, DEXA- ordered.  Check labs.  Anticipatory guidance provided.

## 2014-05-04 NOTE — Assessment & Plan Note (Signed)
Ongoing issue for pt.  Due for DEXA- order entered.  Check Vit D level.

## 2014-05-04 NOTE — Assessment & Plan Note (Signed)
Pap collected. 

## 2014-05-07 ENCOUNTER — Encounter: Payer: Self-pay | Admitting: General Practice

## 2014-05-07 ENCOUNTER — Telehealth: Payer: Self-pay | Admitting: Family Medicine

## 2014-05-07 LAB — CYTOLOGY - PAP

## 2014-05-07 NOTE — Telephone Encounter (Signed)
emmi emailed °

## 2014-05-24 ENCOUNTER — Encounter: Payer: Self-pay | Admitting: Family Medicine

## 2014-05-28 ENCOUNTER — Telehealth: Payer: Self-pay

## 2014-05-28 MED ORDER — CLONAZEPAM 1 MG PO TABS: ORAL_TABLET | ORAL | Status: DC

## 2014-05-28 MED ORDER — BUPROPION HCL ER (XL) 150 MG PO TB24: ORAL_TABLET | ORAL | Status: DC

## 2014-05-28 NOTE — Telephone Encounter (Signed)
Alexa Proctor [phone] [id number] [facility]  [name] called she needs refills on her clonazePAM (KLONOPIN) 1 MG tablet and buPROPion (WELLBUTRIN XL) 150 MG 24 hr tablet, the pharmacy said they sent a request last Thursday and they have not heard anything from us. She is completely out.

## 2014-05-28 NOTE — Telephone Encounter (Signed)
Last OV 05-04-14 Clonazepam last filled 02-02-14 wellbutrin last filled 10-18-13 #30 with 6

## 2014-05-28 NOTE — Telephone Encounter (Signed)
Ok for Clonazepam #30, 3 refills, ok for Wellbutrin #30, 11 refills

## 2014-05-28 NOTE — Telephone Encounter (Signed)
meds filled

## 2014-06-18 ENCOUNTER — Ambulatory Visit
Admission: RE | Admit: 2014-06-18 | Discharge: 2014-06-18 | Disposition: A | Discharge status: Home / Self Care | Payer: 59 | Source: Ambulatory Visit | Attending: Family Medicine | Admitting: Family Medicine

## 2014-06-18 DIAGNOSIS — Z1231 Encounter for screening mammogram for malignant neoplasm of breast: Secondary | ICD-10-CM

## 2014-06-18 DIAGNOSIS — M810 Age-related osteoporosis without current pathological fracture: Secondary | ICD-10-CM

## 2014-06-27 ENCOUNTER — Ambulatory Visit (INDEPENDENT_AMBULATORY_CARE_PROVIDER_SITE_OTHER): Payer: 59 | Admitting: Family Medicine

## 2014-06-27 ENCOUNTER — Encounter: Payer: Self-pay | Admitting: Family Medicine

## 2014-06-27 VITALS — BP 102/70 | HR 96 | Temp 98.0°F | Resp 16 | Wt 103.0 lb

## 2014-06-27 DIAGNOSIS — K219 Gastro-esophageal reflux disease without esophagitis: Secondary | ICD-10-CM | POA: Insufficient documentation

## 2014-06-27 DIAGNOSIS — M810 Age-related osteoporosis without current pathological fracture: Secondary | ICD-10-CM

## 2014-06-27 MED ORDER — OMEPRAZOLE 20 MG PO CPDR: 20.0000 mg | DELAYED_RELEASE_CAPSULE | Freq: Every day | ORAL | Status: DC

## 2014-06-27 MED ORDER — IBANDRONATE SODIUM 150 MG PO TABS: 150.0000 mg | ORAL_TABLET | ORAL | Status: DC

## 2014-06-27 NOTE — Progress Notes (Signed)
Pre visit review using our clinic review tool, if applicable. No additional management support is needed unless otherwise documented below in the visit note. 

## 2014-06-27 NOTE — Progress Notes (Signed)
   Subjective:    Patient ID: Alexa Proctor, female    DOB: 01-12-1956, 58 y.o.   MRN: [id number]  HPI Osteoporosis- pt has had osteoporosis since at least 2012.  DEXA is virtually unchanged in 3 yrs.  Taking 1000 units Vit D and 500mg  Ca.  Pt reports she feels she 'cracked' a rib in Oct when nephew squeezed her.  GERD- pt reports near nightly sxs.  1 episode prevented her from lying down.  Review of Systems For ROS see HPI     Objective:   Physical Exam  Constitutional: She is oriented to person, place, and time. She appears well-developed and well-nourished. No distress.  HENT:  Head: Normocephalic and atraumatic.  Neurological: She is alert and oriented to person, place, and time.  Skin: Skin is warm and dry.  Psychiatric: She has a normal mood and affect. Her behavior is normal. Thought content normal.  Vitals reviewed.         Assessment & Plan:

## 2014-06-27 NOTE — Patient Instructions (Signed)
Follow up as scheduled Start the Boniva monthly- you were right!  Empty stomach for 30 minutes Add additional Calcium daily 600 units Start the Omeprazole once daily for reflux Call with any questions or concerns Happy Holidays!

## 2014-06-28 NOTE — Assessment & Plan Note (Signed)
Chronic problem.  Minimal change from DEXA in 2012 but pt concerned that she 'cracked' a rib after a strong hug.  Due to this fear, she is now interested in tx.  Prefers once monthly treatment due to minimal impact on her schedule.  Start Boniva in addition to increasing Ca intake.  Will follow.

## 2014-06-28 NOTE — Assessment & Plan Note (Signed)
New.  Start low dose PPI as pt is about to start tx for osteoporosis and is already having nightly sxs.  Reviewed dietary and lifestyle modifications.  Will follow.

## 2014-09-28 ENCOUNTER — Encounter: Payer: Self-pay | Admitting: Gastroenterology

## 2014-12-18 ENCOUNTER — Encounter: Payer: Self-pay | Admitting: Gastroenterology

## 2015-02-07 ENCOUNTER — Telehealth: Payer: Self-pay | Admitting: General Practice

## 2015-02-07 MED ORDER — CLONAZEPAM 1 MG PO TABS: ORAL_TABLET | ORAL | Status: DC

## 2015-02-07 NOTE — Telephone Encounter (Signed)
Med filled and faxed.  

## 2015-02-07 NOTE — Telephone Encounter (Signed)
Ok for #30, 2 refills 

## 2015-02-07 NOTE — Telephone Encounter (Signed)
Last OV 06/27/14, no upcoming appts Clonazepam last filled 05-28-14 #30 with 2

## 2015-05-05 ENCOUNTER — Other Ambulatory Visit: Payer: Self-pay | Admitting: Family Medicine

## 2015-05-06 NOTE — Telephone Encounter (Signed)
Medication Detail      Disp Refills Start End     buPROPion (WELLBUTRIN XL) 150 MG 24 hr tablet 30 tablet 11 05/28/2014     Sig: 1 tab by mouth daily    E-Prescribing Status: Receipt confirmed by pharmacy (05/28/2014 3:53 PM EST)     Pharmacy    [id number] - [place], [place] - [address]   Refill sent per [facility] refill protocol/SLS

## 2015-06-30 ENCOUNTER — Other Ambulatory Visit: Payer: Self-pay | Admitting: Family Medicine

## 2015-07-01 NOTE — Telephone Encounter (Signed)
Medication filled to pharmacy as requested.   

## 2015-07-30 ENCOUNTER — Other Ambulatory Visit: Payer: Self-pay | Admitting: Family Medicine

## 2015-07-30 NOTE — Telephone Encounter (Signed)
Med denied, has not been seen in over a year, needs at minimum a mood follow up.

## 2015-08-08 ENCOUNTER — Encounter: Payer: Self-pay | Admitting: Family Medicine

## 2015-08-08 ENCOUNTER — Ambulatory Visit (INDEPENDENT_AMBULATORY_CARE_PROVIDER_SITE_OTHER): Payer: Managed Care, Other (non HMO) | Admitting: Family Medicine

## 2015-08-08 VITALS — BP 104/72 | HR 63 | Temp 98.4°F | Ht 63.0 in | Wt 98.8 lb

## 2015-08-08 DIAGNOSIS — Z23 Encounter for immunization: Secondary | ICD-10-CM

## 2015-08-08 DIAGNOSIS — Z0000 Encounter for general adult medical examination without abnormal findings: Secondary | ICD-10-CM

## 2015-08-08 MED ORDER — CLONAZEPAM 0.5 MG PO TABS: 0.2500 mg | ORAL_TABLET | Freq: Every day | ORAL | Status: DC

## 2015-08-08 NOTE — Patient Instructions (Addendum)
Follow up in 1 year or as needed We'll notify you of your lab results and make any changes if needed Decrease the Clonazepam to 1/2 tab nightly (new prescription) Once you are off the Clonazepam, decrease the Wellbutrin to every other day x2-4 weeks and then every 3rd day for 2-4 weeks and then stop Keep up the good work!  You look great! Call with any questions or concerns Happy New Year!!!

## 2015-08-08 NOTE — Progress Notes (Signed)
Pre visit review using our clinic review tool, if applicable. No additional management support is needed unless otherwise documented below in the visit note. 

## 2015-08-08 NOTE — Progress Notes (Signed)
   Subjective:    Patient ID: Alexa Proctor, female    DOB: [birth year], 60 y.o.   MRN: [id number]  HPI CPE- UTD on pap, colonoscopy.  Due for mammo- prefers to wait until fall when DEXA is due.  Due for flu and Tdap  Pt wants to wean off Clonazepam and Wellbutrin  Review of Systems Patient reports no vision/ hearing changes, adenopathy,fever, weight change,  persistant/recurrent hoarseness , swallowing issues, chest pain, palpitations, edema, persistant/recurrent cough, hemoptysis, dyspnea (rest/exertional/paroxysmal nocturnal), gastrointestinal bleeding (melena, rectal bleeding), abdominal pain, significant heartburn, bowel changes, GU symptoms (dysuria, hematuria, incontinence), Gyn symptoms (abnormal  bleeding, pain),  syncope, focal weakness, memory loss, numbness & tingling, skin/hair/nail changes, abnormal bruising or bleeding, anxiety, or depression.     Objective:   Physical Exam General Appearance:    Alert, cooperative, no distress, appears stated age  Head:    Normocephalic, without obvious abnormality, atraumatic  Eyes:    PERRL, conjunctiva/corneas clear, EOM's intact, fundi    benign, both eyes  Ears:    Normal TM's and external ear canals, both ears  Nose:   Nares normal, septum midline, mucosa normal, no drainage    or sinus tenderness  Throat:   Lips, mucosa, and tongue normal; teeth and gums normal  Neck:   Supple, symmetrical, trachea midline, no adenopathy;    Thyroid: no enlargement/tenderness/nodules  Back:     Symmetric, no curvature, ROM normal, no CVA tenderness  Lungs:     Clear to auscultation bilaterally, respirations unlabored  Chest Wall:    No tenderness or deformity   Heart:    Regular rate and rhythm, S1 and S2 normal, no murmur, rub   or gallop  Breast Exam:    Deferred  Abdomen:     Soft, non-tender, bowel sounds active all four quadrants,    no masses, no organomegaly  Genitalia:    Deferred  Rectal:    Extremities:   Extremities normal,  atraumatic, no cyanosis or edema  Pulses:   2+ and symmetric all extremities  Skin:   Skin color, texture, turgor normal, no rashes or lesions  Lymph nodes:   Cervical, supraclavicular, and axillary nodes normal  Neurologic:   CNII-XII intact, normal strength, sensation and reflexes    throughout          Assessment & Plan:

## 2015-08-11 NOTE — Assessment & Plan Note (Signed)
Pt's PE unchanged from previous.  Encouraged regular eating as pt is on the small side.  UTD on colonoscopy, pap.  Due for mammo but pt wants to wait for fall when DEXA is due.  Check labs.  Anticipatory guidance provided.  Flu and Tdap updated today

## 2015-08-13 ENCOUNTER — Other Ambulatory Visit: Payer: Managed Care, Other (non HMO)

## 2015-08-13 ENCOUNTER — Telehealth: Payer: Self-pay | Admitting: Family Medicine

## 2015-08-13 ENCOUNTER — Other Ambulatory Visit: Payer: Self-pay

## 2015-08-13 LAB — VITAMIN D 25 HYDROXY (VIT D DEFICIENCY, FRACTURES): VITD: 31.99 ng/mL (ref 30.00–100.00)

## 2015-08-13 LAB — HEPATIC FUNCTION PANEL
ALK PHOS: 64 U/L (ref 39–117)
ALT: 32 U/L (ref 0–35)
AST: 37 U/L (ref 0–37)
Albumin: 4.4 g/dL (ref 3.5–5.2)
BILIRUBIN DIRECT: 0.1 mg/dL (ref 0.0–0.3)
BILIRUBIN TOTAL: 0.5 mg/dL (ref 0.2–1.2)
Total Protein: 7 g/dL (ref 6.0–8.3)

## 2015-08-13 LAB — BASIC METABOLIC PANEL
BUN: 14 mg/dL (ref 6–23)
CALCIUM: 9.3 mg/dL (ref 8.4–10.5)
CO2: 28 mEq/L (ref 19–32)
Chloride: 105 mEq/L (ref 96–112)
Creatinine, Ser: 0.92 mg/dL (ref 0.40–1.20)
GFR: 66.39 mL/min (ref >60.00)
Glucose, Bld: 106 mg/dL — ABNORMAL HIGH (ref 70–99)
POTASSIUM: 3.8 meq/L (ref 3.5–5.1)
SODIUM: 139 meq/L (ref 135–145)

## 2015-08-13 LAB — CBC WITH DIFFERENTIAL/PLATELET
Basophils Absolute: 0 10*3/uL (ref 0.0–0.1)
Basophils Relative: 0.5 % (ref 0.0–3.0)
EOS PCT: 2.1 % (ref 0.0–5.0)
Eosinophils Absolute: 0.1 10*3/uL (ref 0.0–0.7)
HEMATOCRIT: 44.2 % (ref 36.0–46.0)
Hemoglobin: 14.8 g/dL (ref 12.0–15.0)
LYMPHS ABS: 2.3 10*3/uL (ref 0.7–4.0)
LYMPHS PCT: 32.4 % (ref 12.0–46.0)
MCHC: 33.4 g/dL (ref 30.0–36.0)
MCV: 95.1 fl (ref 78.0–100.0)
MONOS PCT: 7.8 % (ref 3.0–12.0)
Monocytes Absolute: 0.6 10*3/uL (ref 0.1–1.0)
NEUTROS ABS: 4 10*3/uL (ref 1.4–7.7)
NEUTROS PCT: 57.2 % (ref 43.0–77.0)
PLATELETS: 254 10*3/uL (ref 150.0–400.0)
RBC: 4.65 Mil/uL (ref 3.87–5.11)
RDW: 13.1 % (ref 11.5–15.5)
WBC: 7.1 10*3/uL (ref 4.0–10.5)

## 2015-08-13 LAB — LIPID PANEL
CHOL/HDL RATIO: 3
CHOLESTEROL: 199 mg/dL (ref 0–200)
HDL: 76.7 mg/dL (ref >39.00)
LDL Cholesterol: 114 mg/dL — ABNORMAL HIGH (ref 0–99)
NONHDL: 122.29
Triglycerides: 40 mg/dL (ref 0.0–149.0)
VLDL: 8 mg/dL (ref 0.0–40.0)

## 2015-08-13 LAB — TSH: TSH: 2.54 u[IU]/mL (ref 0.35–4.50)

## 2015-08-13 MED ORDER — BUPROPION HCL ER (XL) 150 MG PO TB24: 150.0000 mg | ORAL_TABLET | Freq: Every day | ORAL | Status: DC

## 2015-08-13 NOTE — Telephone Encounter (Signed)
Caller name: Icyss Relation to pt: self Call back number:[phone] Pharmacy: [id number] - [place], [place] - [address]  Reason for call: Pt came in office requesting needing rx for buPROPion (WELLBUTRIN XL) 150 MG 24 hr tablet, and pt states would like to know if she can get 6 month refill. Please advise.

## 2016-02-14 ENCOUNTER — Other Ambulatory Visit: Payer: Self-pay | Admitting: General Practice

## 2016-02-14 MED ORDER — BUPROPION HCL ER (XL) 150 MG PO TB24: 150.0000 mg | ORAL_TABLET | Freq: Every day | ORAL | Status: DC

## 2016-08-02 ENCOUNTER — Other Ambulatory Visit: Payer: Self-pay | Admitting: Family Medicine

## 2016-08-17 ENCOUNTER — Ambulatory Visit (INDEPENDENT_AMBULATORY_CARE_PROVIDER_SITE_OTHER): Payer: Managed Care, Other (non HMO) | Admitting: Family Medicine

## 2016-08-17 ENCOUNTER — Encounter: Payer: Self-pay | Admitting: Family Medicine

## 2016-08-17 ENCOUNTER — Other Ambulatory Visit (HOSPITAL_COMMUNITY)
Admission: RE | Admit: 2016-08-17 | Discharge: 2016-08-17 | Disposition: A | Discharge status: Home / Self Care | Payer: Managed Care, Other (non HMO) | Source: Ambulatory Visit | Attending: Family Medicine | Admitting: Family Medicine

## 2016-08-17 VITALS — BP 101/68 | HR 72 | Temp 98.0°F | Resp 16 | Ht 63.0 in | Wt 106.1 lb

## 2016-08-17 DIAGNOSIS — Z0000 Encounter for general adult medical examination without abnormal findings: Secondary | ICD-10-CM | POA: Diagnosis not present

## 2016-08-17 DIAGNOSIS — Z1151 Encounter for screening for human papillomavirus (HPV): Secondary | ICD-10-CM | POA: Insufficient documentation

## 2016-08-17 DIAGNOSIS — Z124 Encounter for screening for malignant neoplasm of cervix: Secondary | ICD-10-CM

## 2016-08-17 DIAGNOSIS — Z01419 Encounter for gynecological examination (general) (routine) without abnormal findings: Secondary | ICD-10-CM | POA: Insufficient documentation

## 2016-08-17 DIAGNOSIS — M810 Age-related osteoporosis without current pathological fracture: Secondary | ICD-10-CM | POA: Diagnosis not present

## 2016-08-17 DIAGNOSIS — Z2911 Encounter for prophylactic immunotherapy for respiratory syncytial virus (RSV): Secondary | ICD-10-CM | POA: Diagnosis not present

## 2016-08-17 DIAGNOSIS — Z23 Encounter for immunization: Secondary | ICD-10-CM

## 2016-08-17 DIAGNOSIS — Z1231 Encounter for screening mammogram for malignant neoplasm of breast: Secondary | ICD-10-CM | POA: Diagnosis not present

## 2016-08-17 LAB — BASIC METABOLIC PANEL
BUN: 16 mg/dL (ref 6–23)
CO2: 29 mEq/L (ref 19–32)
Calcium: 9.2 mg/dL (ref 8.4–10.5)
Chloride: 106 mEq/L (ref 96–112)
Creatinine, Ser: 0.88 mg/dL (ref 0.40–1.20)
GFR: 69.64 mL/min (ref >60.00)
GLUCOSE: 92 mg/dL (ref 70–99)
Potassium: 4 mEq/L (ref 3.5–5.1)
SODIUM: 139 meq/L (ref 135–145)

## 2016-08-17 LAB — HEPATIC FUNCTION PANEL
ALK PHOS: 64 U/L (ref 39–117)
ALT: 34 U/L (ref 0–35)
AST: 30 U/L (ref 0–37)
Albumin: 4.2 g/dL (ref 3.5–5.2)
BILIRUBIN DIRECT: 0.1 mg/dL (ref 0.0–0.3)
Total Bilirubin: 0.6 mg/dL (ref 0.2–1.2)
Total Protein: 6.5 g/dL (ref 6.0–8.3)

## 2016-08-17 LAB — CBC WITH DIFFERENTIAL/PLATELET
BASOS ABS: 0.1 10*3/uL (ref 0.0–0.1)
Basophils Relative: 1.4 % (ref 0.0–3.0)
Eosinophils Absolute: 0.1 10*3/uL (ref 0.0–0.7)
Eosinophils Relative: 2.3 % (ref 0.0–5.0)
HCT: 42.8 % (ref 36.0–46.0)
Hemoglobin: 14.4 g/dL (ref 12.0–15.0)
LYMPHS ABS: 1.5 10*3/uL (ref 0.7–4.0)
Lymphocytes Relative: 26.7 % (ref 12.0–46.0)
MCHC: 33.5 g/dL (ref 30.0–36.0)
MCV: 95.1 fl (ref 78.0–100.0)
Monocytes Absolute: 0.4 10*3/uL (ref 0.1–1.0)
Monocytes Relative: 6.9 % (ref 3.0–12.0)
NEUTROS ABS: 3.5 10*3/uL (ref 1.4–7.7)
NEUTROS PCT: 62.7 % (ref 43.0–77.0)
Platelets: 253 10*3/uL (ref 150.0–400.0)
RBC: 4.51 Mil/uL (ref 3.87–5.11)
RDW: 13.3 % (ref 11.5–15.5)
WBC: 5.6 10*3/uL (ref 4.0–10.5)

## 2016-08-17 LAB — LIPID PANEL
Cholesterol: 216 mg/dL — ABNORMAL HIGH (ref 0–200)
HDL: 78 mg/dL (ref >39.00)
LDL Cholesterol: 130 mg/dL — ABNORMAL HIGH (ref 0–99)
NonHDL: 137.93
TRIGLYCERIDES: 41 mg/dL (ref 0.0–149.0)
Total CHOL/HDL Ratio: 3
VLDL: 8.2 mg/dL (ref 0.0–40.0)

## 2016-08-17 LAB — VITAMIN D 25 HYDROXY (VIT D DEFICIENCY, FRACTURES): VITD: 38.69 ng/mL (ref 30.00–100.00)

## 2016-08-17 LAB — TSH: TSH: 3.74 u[IU]/mL (ref 0.35–4.50)

## 2016-08-17 MED ORDER — BUPROPION HCL ER (XL) 150 MG PO TB24: ORAL_TABLET | ORAL | 1 refills | Status: DC

## 2016-08-17 NOTE — Progress Notes (Signed)
   Subjective:    Patient ID: Alexa Proctor, female    DOB: 1955-10-05, 61 y.o.   MRN: [id number]  HPI CPE- UTD on colonoscopy (due 2020), due for pap, mammo, and DEXA.  Pt declines flu.  Wants to do shingles vaccine.   Review of Systems Patient reports no vision/ hearing changes, adenopathy,fever, weight change,  persistant/recurrent hoarseness , swallowing issues, chest pain, palpitations, edema, persistant/recurrent cough, hemoptysis, dyspnea (rest/exertional/paroxysmal nocturnal), gastrointestinal bleeding (melena, rectal bleeding), abdominal pain, significant heartburn, bowel changes, GU symptoms (dysuria, hematuria, incontinence), Gyn symptoms (abnormal  bleeding, pain),  syncope, focal weakness, memory loss, numbness & tingling, skin/hair/nail changes, abnormal bruising or bleeding, anxiety, or depression.     Objective:   Physical Exam  General Appearance:    Alert, cooperative, no distress, appears stated age  Head:    Normocephalic, without obvious abnormality, atraumatic  Eyes:    PERRL, conjunctiva/corneas clear, EOM's intact, fundi    benign, both eyes  Ears:    Normal TM's and external ear canals, both ears  Nose:   Nares normal, septum midline, mucosa normal, no drainage    or sinus tenderness  Throat:   Lips, mucosa, and tongue normal; teeth and gums normal  Neck:   Supple, symmetrical, trachea midline, no adenopathy;    Thyroid: no enlargement/tenderness/nodules  Back:     Symmetric, no curvature, ROM normal, no CVA tenderness  Lungs:     Clear to auscultation bilaterally, respirations unlabored  Chest Wall:    No tenderness or deformity   Heart:    Regular rate and rhythm, S1 and S2 normal, no murmur, rub   or gallop  Breast Exam:    No tenderness, masses, or nipple abnormality  Abdomen:     Soft, non-tender, bowel sounds active all four quadrants,    no masses, no organomegaly  Genitalia:    External genitalia normal, cervix normal in appearance, no CMT, uterus in  normal size and position, adnexa w/out mass or tenderness, mucosa pink and moist, no lesions or discharge present  Rectal:    Normal external appearance  Extremities:   Extremities normal, atraumatic, no cyanosis or edema  Pulses:   2+ and symmetric all extremities  Skin:   Skin color, texture, turgor normal, no rashes or lesions  Lymph nodes:   Cervical, supraclavicular, and axillary nodes normal  Neurologic:   CNII-XII intact, normal strength, sensation and reflexes    throughout          Assessment & Plan:

## 2016-08-17 NOTE — Assessment & Plan Note (Signed)
Chronic problem.  Due for repeat DEXA- order entered.  Check Vit D.  Replete prn.  Restart meds prn.

## 2016-08-17 NOTE — Progress Notes (Signed)
Pre visit review using our clinic review tool, if applicable. No additional management support is needed unless otherwise documented below in the visit note. 

## 2016-08-17 NOTE — Assessment & Plan Note (Signed)
Pt's PE WNL.  UTD on colonoscopy.  Due for repeat DEXA, mammo- orders entered.  Pap collected today.  Check labs.  Shingles vaccine given.  Anticipatory guidance provided.

## 2016-08-17 NOTE — Patient Instructions (Signed)
Follow up in 1 year or as needed We'll notify you of your lab results and make any changes if needed We'll call you with your mammogram and bone density appts Continue to work on healthy diet and regular exercise- you look great! Call with any questions or concerns Happy New Year!!!

## 2016-08-17 NOTE — Addendum Note (Signed)
Addended by: Geannie RisenBRODMERKEL, JESSICA L on: 08/17/2016 09:46 AM   Modules accepted: Orders

## 2016-08-17 NOTE — Assessment & Plan Note (Signed)
Pap collected. 

## 2016-08-18 LAB — CYTOLOGY - PAP
Diagnosis: NEGATIVE
HPV: NOT DETECTED

## 2016-09-11 ENCOUNTER — Other Ambulatory Visit: Payer: Managed Care, Other (non HMO)

## 2016-09-11 ENCOUNTER — Ambulatory Visit: Payer: Managed Care, Other (non HMO)

## 2016-09-29 ENCOUNTER — Ambulatory Visit
Admission: RE | Admit: 2016-09-29 | Discharge: 2016-09-29 | Disposition: A | Discharge status: Home / Self Care | Payer: Managed Care, Other (non HMO) | Source: Ambulatory Visit | Attending: Family Medicine | Admitting: Family Medicine

## 2016-09-29 DIAGNOSIS — M810 Age-related osteoporosis without current pathological fracture: Secondary | ICD-10-CM

## 2016-09-29 DIAGNOSIS — Z1231 Encounter for screening mammogram for malignant neoplasm of breast: Secondary | ICD-10-CM

## 2016-09-30 ENCOUNTER — Other Ambulatory Visit: Payer: Self-pay | Admitting: Family Medicine

## 2016-09-30 DIAGNOSIS — R928 Other abnormal and inconclusive findings on diagnostic imaging of breast: Secondary | ICD-10-CM

## 2016-10-02 NOTE — Progress Notes (Signed)
Called pt and lmovm to return call.

## 2016-10-05 ENCOUNTER — Other Ambulatory Visit: Payer: Managed Care, Other (non HMO)

## 2016-10-07 ENCOUNTER — Ambulatory Visit
Admission: RE | Admit: 2016-10-07 | Discharge: 2016-10-07 | Disposition: A | Discharge status: Home / Self Care | Payer: Managed Care, Other (non HMO) | Source: Ambulatory Visit | Attending: Family Medicine | Admitting: Family Medicine

## 2016-10-07 ENCOUNTER — Other Ambulatory Visit: Payer: Self-pay | Admitting: Family Medicine

## 2016-10-07 DIAGNOSIS — N6310 Unspecified lump in the right breast, unspecified quadrant: Secondary | ICD-10-CM

## 2016-10-07 DIAGNOSIS — R928 Other abnormal and inconclusive findings on diagnostic imaging of breast: Secondary | ICD-10-CM

## 2017-02-06 ENCOUNTER — Other Ambulatory Visit: Payer: Self-pay | Admitting: Family Medicine

## 2017-08-07 ENCOUNTER — Other Ambulatory Visit: Payer: Self-pay | Admitting: Family Medicine

## 2017-10-28 ENCOUNTER — Encounter: Payer: Self-pay | Admitting: Family Medicine

## 2017-10-28 ENCOUNTER — Other Ambulatory Visit: Payer: Self-pay

## 2017-10-28 ENCOUNTER — Ambulatory Visit (INDEPENDENT_AMBULATORY_CARE_PROVIDER_SITE_OTHER): Payer: Managed Care, Other (non HMO) | Admitting: Family Medicine

## 2017-10-28 VITALS — BP 98/64 | HR 99 | Temp 98.0°F | Resp 16 | Ht 63.0 in | Wt 113.1 lb

## 2017-10-28 DIAGNOSIS — Z0000 Encounter for general adult medical examination without abnormal findings: Secondary | ICD-10-CM | POA: Diagnosis not present

## 2017-10-28 DIAGNOSIS — F329 Major depressive disorder, single episode, unspecified: Secondary | ICD-10-CM

## 2017-10-28 DIAGNOSIS — F419 Anxiety disorder, unspecified: Secondary | ICD-10-CM

## 2017-10-28 DIAGNOSIS — Z1231 Encounter for screening mammogram for malignant neoplasm of breast: Secondary | ICD-10-CM

## 2017-10-28 DIAGNOSIS — M810 Age-related osteoporosis without current pathological fracture: Secondary | ICD-10-CM

## 2017-10-28 LAB — BASIC METABOLIC PANEL
BUN: 14 mg/dL (ref 6–23)
CALCIUM: 9.8 mg/dL (ref 8.4–10.5)
CHLORIDE: 105 meq/L (ref 96–112)
CO2: 28 meq/L (ref 19–32)
Creatinine, Ser: 0.9 mg/dL (ref 0.40–1.20)
GFR: 67.59 mL/min (ref >60.00)
Glucose, Bld: 89 mg/dL (ref 70–99)
Potassium: 4.2 mEq/L (ref 3.5–5.1)
SODIUM: 140 meq/L (ref 135–145)

## 2017-10-28 LAB — CBC WITH DIFFERENTIAL/PLATELET
BASOS PCT: 0.6 % (ref 0.0–3.0)
Basophils Absolute: 0.1 10*3/uL (ref 0.0–0.1)
EOS ABS: 0.1 10*3/uL (ref 0.0–0.7)
Eosinophils Relative: 1.2 % (ref 0.0–5.0)
HEMATOCRIT: 44.9 % (ref 36.0–46.0)
Hemoglobin: 15.4 g/dL — ABNORMAL HIGH (ref 12.0–15.0)
LYMPHS PCT: 19.2 % (ref 12.0–46.0)
Lymphs Abs: 1.6 10*3/uL (ref 0.7–4.0)
MCHC: 34.2 g/dL (ref 30.0–36.0)
MCV: 94.6 fl (ref 78.0–100.0)
Monocytes Absolute: 0.3 10*3/uL (ref 0.1–1.0)
Monocytes Relative: 4 % (ref 3.0–12.0)
NEUTROS ABS: 6.1 10*3/uL (ref 1.4–7.7)
Neutrophils Relative %: 75 % (ref 43.0–77.0)
PLATELETS: 262 10*3/uL (ref 150.0–400.0)
RBC: 4.74 Mil/uL (ref 3.87–5.11)
RDW: 13 % (ref 11.5–15.5)
WBC: 8.2 10*3/uL (ref 4.0–10.5)

## 2017-10-28 LAB — HEPATIC FUNCTION PANEL
ALT: 24 U/L (ref 0–35)
AST: 25 U/L (ref 0–37)
Albumin: 4.1 g/dL (ref 3.5–5.2)
Alkaline Phosphatase: 74 U/L (ref 39–117)
BILIRUBIN DIRECT: 0.1 mg/dL (ref 0.0–0.3)
BILIRUBIN TOTAL: 0.8 mg/dL (ref 0.2–1.2)
Total Protein: 6.6 g/dL (ref 6.0–8.3)

## 2017-10-28 LAB — LIPID PANEL
Cholesterol: 196 mg/dL (ref 0–200)
HDL: 72.2 mg/dL (ref >39.00)
LDL Cholesterol: 116 mg/dL — ABNORMAL HIGH (ref 0–99)
NONHDL: 124.22
Total CHOL/HDL Ratio: 3
Triglycerides: 41 mg/dL (ref 0.0–149.0)
VLDL: 8.2 mg/dL (ref 0.0–40.0)

## 2017-10-28 LAB — TSH: TSH: 2.79 u[IU]/mL (ref 0.35–4.50)

## 2017-10-28 LAB — VITAMIN D 25 HYDROXY (VIT D DEFICIENCY, FRACTURES): VITD: 34.93 ng/mL (ref 30.00–100.00)

## 2017-10-28 MED ORDER — BUPROPION HCL ER (XL) 300 MG PO TB24: 300.0000 mg | ORAL_TABLET | Freq: Every day | ORAL | 1 refills | Status: DC

## 2017-10-28 NOTE — Assessment & Plan Note (Signed)
Pt's PE WNL.  UTD on pap, colonoscopy, Tdap.  Due for mammo.  Order entered.  Check labs.  Anticipatory guidance provided.

## 2017-10-28 NOTE — Assessment & Plan Note (Signed)
Chronic problem.  UTD on DEXA.  Check Vit D level and replete prn. 

## 2017-10-28 NOTE — Patient Instructions (Signed)
Follow up in 1 year or as needed We'll notify you of your lab results and make any changes if needed Keep up the good work on healthy diet and regular exercise- you look great!!! I put the order in for the mammogram- please schedule at your convenience Call with any questions or concerns Happy Spring!!!

## 2017-10-28 NOTE — Progress Notes (Signed)
   Subjective:    Patient ID: Alexa Proctor, female    DOB: [birth year], 62 y.o.   MRN: [id number]  HPI CPE- UTD on colonoscopy, pap.  Due for mammo.  UTD on Tdap.   Review of Systems Patient reports no vision/ hearing changes, adenopathy,fever, weight change,  persistant/recurrent hoarseness , swallowing issues, chest pain, palpitations, edema, persistant/recurrent cough, hemoptysis, dyspnea (rest/exertional/paroxysmal nocturnal), gastrointestinal bleeding (melena, rectal bleeding), abdominal pain, significant heartburn, bowel changes, GU symptoms (dysuria, hematuria, incontinence), Gyn symptoms (abnormal  bleeding, pain),  syncope, focal weakness, memory loss, numbness & tingling, skin/hair/nail changes, abnormal bruising or bleeding.   Anxiety/depression- pt was previously on 300mg  and feels she should go back up to the higher dose based on sxs.    Objective:   Physical Exam General Appearance:    Alert, cooperative, no distress, appears stated age  Head:    Normocephalic, without obvious abnormality, atraumatic  Eyes:    PERRL, conjunctiva/corneas clear, EOM's intact, fundi    benign, both eyes  Ears:    Normal TM's and external ear canals, both ears  Nose:   Nares normal, septum midline, mucosa normal, no drainage    or sinus tenderness  Throat:   Lips, mucosa, and tongue normal; teeth and gums normal  Neck:   Supple, symmetrical, trachea midline, no adenopathy;    Thyroid: no enlargement/tenderness/nodules  Back:     Symmetric, no curvature, ROM normal, no CVA tenderness  Lungs:     Clear to auscultation bilaterally, respirations unlabored  Chest Wall:    No tenderness or deformity   Heart:    Regular rate and rhythm, S1 and S2 normal, no murmur, rub   or gallop  Breast Exam:    Deferred to mammo  Abdomen:     Soft, non-tender, bowel sounds active all four quadrants,    no masses, no organomegaly  Genitalia:    Deferred  Rectal:    Extremities:   Extremities normal,  atraumatic, no cyanosis or edema  Pulses:   2+ and symmetric all extremities  Skin:   Skin color, texture, turgor normal, no rashes or lesions  Lymph nodes:   Cervical, supraclavicular, and axillary nodes normal  Neurologic:   CNII-XII intact, normal strength, sensation and reflexes    throughout          Assessment & Plan:

## 2017-10-28 NOTE — Assessment & Plan Note (Signed)
Deteriorated.  Will increase Wellbutrin back up to 300mg  daily.  Pt expressed understanding and is in agreement w/ plan.

## 2017-10-29 ENCOUNTER — Encounter: Payer: Self-pay | Admitting: General Practice

## 2017-11-29 ENCOUNTER — Ambulatory Visit
Admission: RE | Admit: 2017-11-29 | Discharge: 2017-11-29 | Disposition: A | Discharge status: Home / Self Care | Payer: Managed Care, Other (non HMO) | Source: Ambulatory Visit | Attending: Family Medicine | Admitting: Family Medicine

## 2017-11-29 DIAGNOSIS — Z1231 Encounter for screening mammogram for malignant neoplasm of breast: Secondary | ICD-10-CM

## 2018-04-20 ENCOUNTER — Other Ambulatory Visit: Payer: Self-pay | Admitting: Family Medicine

## 2018-08-31 ENCOUNTER — Other Ambulatory Visit: Payer: Self-pay

## 2018-08-31 ENCOUNTER — Ambulatory Visit: Payer: Managed Care, Other (non HMO) | Admitting: Family Medicine

## 2018-08-31 ENCOUNTER — Encounter: Payer: Self-pay | Admitting: Family Medicine

## 2018-08-31 VITALS — BP 101/70 | HR 70 | Temp 98.4°F | Resp 16 | Ht 63.0 in | Wt 106.1 lb

## 2018-08-31 DIAGNOSIS — L989 Disorder of the skin and subcutaneous tissue, unspecified: Secondary | ICD-10-CM

## 2018-08-31 NOTE — Patient Instructions (Signed)
Follow up as needed or as scheduled We'll call you with your Dermatology appt Try not to pick/scratch Call with any questions or concerns Happy Valentine's Day!!!

## 2018-08-31 NOTE — Progress Notes (Signed)
   Subjective:    Patient ID: Alexa Proctor, female    DOB: 18-Aug-1955, 63 y.o.   MRN: [id number]  HPI Spot on nose- pt reports area appeared around summer.  Pt peeled area off 'a couple of times' which was painful.  Some TTP w/ direct pressure.  No ulceration or oozing.  Has slowly enlarged.   Review of Systems For ROS see HPI     Objective:   Physical Exam Vitals signs reviewed.  Constitutional:      General: She is not in acute distress.    Appearance: Normal appearance. She is normal weight.  HENT:     Head: Normocephalic and atraumatic.     Nose:     Comments: Pearly lesion on tip of nose on ulcerated base Neurological:     General: No focal deficit present.     Mental Status: She is alert and oriented to person, place, and time.  Psychiatric:        Mood and Affect: Mood normal.        Behavior: Behavior normal.           Assessment & Plan:  Skin lesion of nose- new.  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 Refer to Derm for complete evaluation and tx.  Pt expressed understanding and is in agreement w/ plan.

## 2018-10-10 ENCOUNTER — Other Ambulatory Visit: Payer: Self-pay | Admitting: Family Medicine

## 2019-01-11 ENCOUNTER — Encounter

## 2019-01-11 ENCOUNTER — Ambulatory Visit (INDEPENDENT_AMBULATORY_CARE_PROVIDER_SITE_OTHER): Payer: Managed Care, Other (non HMO) | Admitting: Family Medicine

## 2019-01-11 ENCOUNTER — Other Ambulatory Visit: Payer: Self-pay

## 2019-01-11 ENCOUNTER — Encounter: Payer: Self-pay | Admitting: Family Medicine

## 2019-01-11 VITALS — BP 119/80 | HR 69 | Temp 98.0°F | Resp 16 | Ht 63.0 in | Wt 105.5 lb

## 2019-01-11 DIAGNOSIS — Z0000 Encounter for general adult medical examination without abnormal findings: Secondary | ICD-10-CM | POA: Diagnosis not present

## 2019-01-11 DIAGNOSIS — M810 Age-related osteoporosis without current pathological fracture: Secondary | ICD-10-CM | POA: Diagnosis not present

## 2019-01-11 DIAGNOSIS — E785 Hyperlipidemia, unspecified: Secondary | ICD-10-CM | POA: Diagnosis not present

## 2019-01-11 DIAGNOSIS — Z1211 Encounter for screening for malignant neoplasm of colon: Secondary | ICD-10-CM

## 2019-01-11 DIAGNOSIS — Z1231 Encounter for screening mammogram for malignant neoplasm of breast: Secondary | ICD-10-CM

## 2019-01-11 LAB — CBC WITH DIFFERENTIAL/PLATELET
Basophils Absolute: 0.1 10*3/uL (ref 0.0–0.1)
Basophils Relative: 1.2 % (ref 0.0–3.0)
Eosinophils Absolute: 0.1 10*3/uL (ref 0.0–0.7)
Eosinophils Relative: 2.4 % (ref 0.0–5.0)
HCT: 45.1 % (ref 36.0–46.0)
Hemoglobin: 14.9 g/dL (ref 12.0–15.0)
Lymphocytes Relative: 33.2 % (ref 12.0–46.0)
Lymphs Abs: 1.6 10*3/uL (ref 0.7–4.0)
MCHC: 33.1 g/dL (ref 30.0–36.0)
MCV: 96.9 fl (ref 78.0–100.0)
Monocytes Absolute: 0.3 10*3/uL (ref 0.1–1.0)
Monocytes Relative: 6.9 % (ref 3.0–12.0)
Neutro Abs: 2.6 10*3/uL (ref 1.4–7.7)
Neutrophils Relative %: 56.3 % (ref 43.0–77.0)
Platelets: 251 10*3/uL (ref 150.0–400.0)
RBC: 4.65 Mil/uL (ref 3.87–5.11)
RDW: 12.9 % (ref 11.5–15.5)
WBC: 4.7 10*3/uL (ref 4.0–10.5)

## 2019-01-11 LAB — HEPATIC FUNCTION PANEL
ALT: 28 U/L (ref 0–35)
AST: 31 U/L (ref 0–37)
Albumin: 4.5 g/dL (ref 3.5–5.2)
Alkaline Phosphatase: 73 U/L (ref 39–117)
Bilirubin, Direct: 0.1 mg/dL (ref 0.0–0.3)
Total Bilirubin: 0.7 mg/dL (ref 0.2–1.2)
Total Protein: 6.5 g/dL (ref 6.0–8.3)

## 2019-01-11 LAB — LIPID PANEL
Cholesterol: 216 mg/dL — ABNORMAL HIGH (ref 0–200)
HDL: 76.6 mg/dL (ref >39.00)
LDL Cholesterol: 132 mg/dL — ABNORMAL HIGH (ref 0–99)
NonHDL: 139.49
Total CHOL/HDL Ratio: 3
Triglycerides: 39 mg/dL (ref 0.0–149.0)
VLDL: 7.8 mg/dL (ref 0.0–40.0)

## 2019-01-11 LAB — BASIC METABOLIC PANEL
BUN: 15 mg/dL (ref 6–23)
CO2: 27 mEq/L (ref 19–32)
Calcium: 9.7 mg/dL (ref 8.4–10.5)
Chloride: 104 mEq/L (ref 96–112)
Creatinine, Ser: 0.87 mg/dL (ref 0.40–1.20)
GFR: 65.87 mL/min (ref >60.00)
Glucose, Bld: 81 mg/dL (ref 70–99)
Potassium: 4.2 mEq/L (ref 3.5–5.1)
Sodium: 141 mEq/L (ref 135–145)

## 2019-01-11 LAB — TSH: TSH: 2.04 u[IU]/mL (ref 0.35–4.50)

## 2019-01-11 LAB — VITAMIN D 25 HYDROXY (VIT D DEFICIENCY, FRACTURES): VITD: 51.16 ng/mL (ref 30.00–100.00)

## 2019-01-11 NOTE — Assessment & Plan Note (Signed)
Chronic problem.  Repeat DEXA and check Vit D.  Replete prn.

## 2019-01-11 NOTE — Assessment & Plan Note (Signed)
Pt's PE WNL.  Due for mammo, DEXA (ordered).  Due for repeat colonoscopy- ordered.  Check labs.  Anticipatory guidance provided.

## 2019-01-11 NOTE — Patient Instructions (Signed)
Follow up in 1 year or as needed We'll notify you of your lab results and make any changes if needed Keep up the good work!  You look great! We'll call you with your GI appts and appt for mammo and bone density exams Call with any questions or concerns Stay Safe!!!

## 2019-01-11 NOTE — Progress Notes (Signed)
   Subjective:    Patient ID: Alexa Proctor, female    DOB: 1956-02-15, 63 y.o.   MRN: [id number]  HPI CPE- due for bone density, mammogram, colonoscopy.   Review of Systems Patient reports no vision/ hearing changes, adenopathy,fever, weight change,  persistant/recurrent hoarseness , swallowing issues, chest pain, palpitations, edema, persistant/recurrent cough, hemoptysis, dyspnea (rest/exertional/paroxysmal nocturnal), gastrointestinal bleeding (melena, rectal bleeding), abdominal pain, significant heartburn, bowel changes, GU symptoms (dysuria, hematuria, incontinence), Gyn symptoms (abnormal  bleeding, pain),  syncope, focal weakness, memory loss, numbness & tingling, skin/hair/nail changes, abnormal bruising or bleeding, anxiety, or depression.     Objective:   Physical Exam General Appearance:    Alert, cooperative, no distress, appears stated age  Head:    Normocephalic, without obvious abnormality, atraumatic  Eyes:    PERRL, conjunctiva/corneas clear, EOM's intact, fundi    benign, both eyes  Ears:    Normal TM's and external ear canals, both ears  Nose:   Nares normal, septum midline, mucosa normal, no drainage    or sinus tenderness  Throat:   Lips, mucosa, and tongue normal; teeth and gums normal  Neck:   Supple, symmetrical, trachea midline, no adenopathy;    Thyroid: no enlargement/tenderness/nodules  Back:     Symmetric, no curvature, ROM normal, no CVA tenderness  Lungs:     Clear to auscultation bilaterally, respirations unlabored  Chest Wall:    No tenderness or deformity   Heart:    Regular rate and rhythm, S1 and S2 normal, no murmur, rub   or gallop  Breast Exam:    Deferred to mammo  Abdomen:     Soft, non-tender, bowel sounds active all four quadrants,    no masses, no organomegaly  Genitalia:    Deferred  Rectal:    Extremities:   Extremities normal, atraumatic, no cyanosis or edema  Pulses:   2+ and symmetric all extremities  Skin:   Skin color, texture,  turgor normal, no rashes or lesions  Lymph nodes:   Cervical, supraclavicular, and axillary nodes normal  Neurologic:   CNII-XII intact, normal strength, sensation and reflexes    throughout          Assessment & Plan:

## 2019-02-20 ENCOUNTER — Encounter: Payer: Self-pay | Admitting: Family Medicine

## 2019-04-04 ENCOUNTER — Encounter: Payer: Self-pay | Admitting: Family Medicine

## 2019-04-14 ENCOUNTER — Other Ambulatory Visit: Payer: Self-pay | Admitting: Family Medicine

## 2019-04-19 ENCOUNTER — Ambulatory Visit: Payer: Managed Care, Other (non HMO)

## 2019-04-19 ENCOUNTER — Other Ambulatory Visit: Payer: Managed Care, Other (non HMO)

## 2019-10-11 ENCOUNTER — Other Ambulatory Visit: Payer: Self-pay | Admitting: Family Medicine

## 2020-01-12 ENCOUNTER — Encounter: Payer: Self-pay | Admitting: Family Medicine

## 2020-01-12 ENCOUNTER — Ambulatory Visit (INDEPENDENT_AMBULATORY_CARE_PROVIDER_SITE_OTHER): Payer: 59 | Admitting: Family Medicine

## 2020-01-12 ENCOUNTER — Other Ambulatory Visit: Payer: Self-pay

## 2020-01-12 ENCOUNTER — Other Ambulatory Visit: Payer: Self-pay | Admitting: Family Medicine

## 2020-01-12 VITALS — BP 116/78 | HR 72 | Temp 98.8°F | Resp 16 | Ht 63.0 in | Wt 111.5 lb

## 2020-01-12 DIAGNOSIS — M810 Age-related osteoporosis without current pathological fracture: Secondary | ICD-10-CM | POA: Diagnosis not present

## 2020-01-12 DIAGNOSIS — E785 Hyperlipidemia, unspecified: Secondary | ICD-10-CM

## 2020-01-12 DIAGNOSIS — Z1231 Encounter for screening mammogram for malignant neoplasm of breast: Secondary | ICD-10-CM | POA: Diagnosis not present

## 2020-01-12 DIAGNOSIS — Z0000 Encounter for general adult medical examination without abnormal findings: Secondary | ICD-10-CM

## 2020-01-12 DIAGNOSIS — Z1211 Encounter for screening for malignant neoplasm of colon: Secondary | ICD-10-CM

## 2020-01-12 MED ORDER — BUPROPION HCL ER (XL) 300 MG PO TB24: 300.0000 mg | ORAL_TABLET | Freq: Every day | ORAL | 1 refills | Status: DC

## 2020-01-12 NOTE — Addendum Note (Signed)
Addended by: Sheliah Hatch on: 01/12/2020 02:18 PM   Modules accepted: Orders

## 2020-01-12 NOTE — Assessment & Plan Note (Signed)
Due for DEXA.  Check Vit D and replete prn.

## 2020-01-12 NOTE — Progress Notes (Signed)
   Subjective:    Patient ID: Alexa Proctor, female    DOB: Mar 04, 1956, 64 y.o.   MRN: [id number]  HPI CPE- Due for pap (wants to return for this), mammo, colonoscopy, DEXA.   Review of Systems Patient reports no vision/ hearing changes, adenopathy,fever, weight change,  persistant/recurrent hoarseness , swallowing issues, chest pain, palpitations, edema, persistant/recurrent cough, hemoptysis, dyspnea (rest/exertional/paroxysmal nocturnal), gastrointestinal bleeding (melena, rectal bleeding), abdominal pain, significant heartburn, bowel changes, GU symptoms (dysuria, hematuria, incontinence), Gyn symptoms (abnormal  bleeding, pain),  syncope, focal weakness, memory loss, numbness & tingling, skin/hair/nail changes, abnormal bruising or bleeding, anxiety, or depression.   This visit occurred during the SARS-CoV-2 public health emergency.  Safety protocols were in place, including screening questions prior to the visit, additional usage of staff PPE, and extensive cleaning of exam room while observing appropriate contact time as indicated for disinfecting solutions.       Objective:   Physical Exam General Appearance:    Alert, cooperative, no distress, appears stated age  Head:    Normocephalic, without obvious abnormality, atraumatic  Eyes:    PERRL, conjunctiva/corneas clear, EOM's intact, fundi    benign, both eyes  Ears:    Normal TM's and external ear canals, both ears  Nose:   Deferred due to COVID  Throat:   Neck:   Supple, symmetrical, trachea midline, no adenopathy;    Thyroid: no enlargement/tenderness/nodules  Back:     Symmetric, no curvature, ROM normal, no CVA tenderness  Lungs:     Clear to auscultation bilaterally, respirations unlabored  Chest Wall:    No tenderness or deformity   Heart:    Regular rate and rhythm, S1 and S2 normal, no murmur, rub   or gallop  Breast Exam:    Deferred to mammo  Abdomen:     Soft, non-tender, bowel sounds active all four quadrants,     no masses, no organomegaly  Genitalia:    Deferred at pt's request  Rectal:    Extremities:   Extremities normal, atraumatic, no cyanosis or edema  Pulses:   2+ and symmetric all extremities  Skin:   Skin color, texture, turgor normal, no rashes or lesions  Lymph nodes:   Cervical, supraclavicular, and axillary nodes normal  Neurologic:   CNII-XII intact, normal strength, sensation and reflexes    throughout          Assessment & Plan:

## 2020-01-12 NOTE — Assessment & Plan Note (Signed)
Chronic problem.  Attempting to control w/ diet and exercise.  Check labs.  Start meds prn. 

## 2020-01-12 NOTE — Patient Instructions (Addendum)
Schedule your pap at your convenience We'll notify you of your lab results and make any changes if needed Keep up the good work on healthy diet and regular exercise- you look great! We'll call you with your mammogram and bone density appt We'll call you with your GI appt for the colonoscopy Call with any questions or concerns Have a great summer!!!

## 2020-01-12 NOTE — Addendum Note (Signed)
Addended by: Lana Fish on: 01/12/2020 03:38 PM   Modules accepted: Orders

## 2020-01-12 NOTE — Assessment & Plan Note (Signed)
Pt's PE WNL.  Due for colonoscopy- referral placed, mammo- ordered, DEXA- ordered, and she will return for pap.  UTD on immunizations.  Check labs.  Anticipatory guidance provided.

## 2020-01-13 LAB — HEPATIC FUNCTION PANEL
AG Ratio: 2.4 (calc) (ref 1.0–2.5)
ALT: 33 U/L — ABNORMAL HIGH (ref 6–29)
AST: 30 U/L (ref 10–35)
Albumin: 4.6 g/dL (ref 3.6–5.1)
Alkaline phosphatase (APISO): 67 U/L (ref 37–153)
Bilirubin, Direct: 0.1 mg/dL (ref 0.0–0.2)
Globulin: 1.9 g/dL (calc) (ref 1.9–3.7)
Indirect Bilirubin: 0.6 mg/dL (calc) (ref 0.2–1.2)
Total Bilirubin: 0.7 mg/dL (ref 0.2–1.2)
Total Protein: 6.5 g/dL (ref 6.1–8.1)

## 2020-01-13 LAB — LIPID PANEL
Cholesterol: 233 mg/dL — ABNORMAL HIGH (ref <200)
HDL: 80 mg/dL (ref >=50)
LDL Cholesterol (Calc): 139 mg/dL (calc) — ABNORMAL HIGH
Non-HDL Cholesterol (Calc): 153 mg/dL (calc) — ABNORMAL HIGH (ref <130)
Total CHOL/HDL Ratio: 2.9 (calc) (ref <5.0)
Triglycerides: 45 mg/dL (ref <150)

## 2020-01-13 LAB — BASIC METABOLIC PANEL
BUN: 14 mg/dL (ref 7–25)
CO2: 27 mmol/L (ref 20–32)
Calcium: 9.7 mg/dL (ref 8.6–10.4)
Chloride: 105 mmol/L (ref 98–110)
Creat: 0.94 mg/dL (ref 0.50–0.99)
Glucose, Bld: 95 mg/dL (ref 65–99)
Potassium: 4.2 mmol/L (ref 3.5–5.3)
Sodium: 140 mmol/L (ref 135–146)

## 2020-01-13 LAB — CBC WITH DIFFERENTIAL/PLATELET
Absolute Monocytes: 319 cells/uL (ref 200–950)
Basophils Absolute: 42 cells/uL (ref 0–200)
Basophils Relative: 1.1 %
Eosinophils Absolute: 122 cells/uL (ref 15–500)
Eosinophils Relative: 3.2 %
HCT: 42.1 % (ref 35.0–45.0)
Hemoglobin: 14.2 g/dL (ref 11.7–15.5)
Lymphs Abs: 1189 cells/uL (ref 850–3900)
MCH: 31.4 pg (ref 27.0–33.0)
MCHC: 33.7 g/dL (ref 32.0–36.0)
MCV: 93.1 fL (ref 80.0–100.0)
MPV: 10.2 fL (ref 7.5–12.5)
Monocytes Relative: 8.4 %
Neutro Abs: 2128 cells/uL (ref 1500–7800)
Neutrophils Relative %: 56 %
Platelets: 253 10*3/uL (ref 140–400)
RBC: 4.52 10*6/uL (ref 3.80–5.10)
RDW: 12 % (ref 11.0–15.0)
Total Lymphocyte: 31.3 %
WBC: 3.8 10*3/uL (ref 3.8–10.8)

## 2020-01-13 LAB — TSH: TSH: 2.88 mIU/L (ref 0.40–4.50)

## 2020-01-13 LAB — VITAMIN D 25 HYDROXY (VIT D DEFICIENCY, FRACTURES): Vit D, 25-Hydroxy: 42 ng/mL (ref 30–100)

## 2020-01-16 ENCOUNTER — Encounter: Payer: Self-pay | Admitting: General Practice

## 2020-01-18 ENCOUNTER — Other Ambulatory Visit: Payer: Self-pay | Admitting: Family Medicine

## 2020-01-18 DIAGNOSIS — Z1231 Encounter for screening mammogram for malignant neoplasm of breast: Secondary | ICD-10-CM

## 2020-01-18 DIAGNOSIS — M810 Age-related osteoporosis without current pathological fracture: Secondary | ICD-10-CM

## 2020-02-28 ENCOUNTER — Ambulatory Visit (INDEPENDENT_AMBULATORY_CARE_PROVIDER_SITE_OTHER): Payer: 59 | Admitting: Orthopedic Surgery

## 2020-02-28 ENCOUNTER — Ambulatory Visit: Payer: Self-pay

## 2020-02-28 DIAGNOSIS — R29898 Other symptoms and signs involving the musculoskeletal system: Secondary | ICD-10-CM

## 2020-02-28 DIAGNOSIS — M25511 Pain in right shoulder: Secondary | ICD-10-CM | POA: Diagnosis not present

## 2020-03-02 ENCOUNTER — Encounter: Payer: Self-pay | Admitting: Orthopedic Surgery

## 2020-03-02 NOTE — Progress Notes (Signed)
Office Visit Note   Patient: Alexa Proctor           Date of Birth: 02/05/1956           MRN: [id number] Visit Date: 02/28/2020 Requested by: Sheliah Hatch, MD [address] [place],  [place] [address] PCP: Sheliah Hatch, MD  Subjective: Chief Complaint  Patient presents with  . Right Shoulder - Pain    HPI: Alexa Proctor is a 64 y.o. female who presents to the office complaining of right arm pain.  Patient notes 3 months of right arm pain that has been worse in the last month.  She localizes pain to the mid humerus.  She denies any weakness but notes that pain is worse when she reaches out and away from her body.  She has no difficulty raising her arm above her head.  She is right-hand dominant.  She denies any neck pain, numbness/tingling, radicular pain.  Pain does wake her up at night.  She does note constant aching pain but this improves with decrease use.  Pain is worse when lifting.  She denies any previous surgery on her neck or shoulder.  She takes ibuprofen which is usually helpful but she is concerned as she is taking it nightly for the last months.  Denies any history of diabetes or thyroid disorders..                ROS: All systems reviewed are negative as they relate to the chief complaint within the history of present illness.  Patient denies fevers or chills.  Assessment & Plan: Visit Diagnoses:  1. Right shoulder pain, unspecified chronicity   2. Weakness of shoulder     Plan: Patient is a 64 year old female presents complaining of right arm pain.  She has had pain for 3 months that has been worse in the last month.  Pain is worse with reaching out away from her body as well as with lifting.  On exam she has weakness of the infraspinatus compared with contralateral side as well as crepitus that is felt with passive range of motion shoulder.  Radiographs taken today are negative for any degenerative changes, fractures, loss of acromiohumeral interval.   Concern for rotator cuff tear.  Ordered MRI arthrogram of the right shoulder for further evaluation.  Patient agreed with plan.  Follow-up after MRI to review results.  Follow-Up Instructions: No follow-ups on file.   Orders:  Orders Placed This Encounter  Procedures  . XR Shoulder Right  . MR SHOULDER RIGHT W CONTRAST  . Arthrogram   No orders of the defined types were placed in this encounter.     Procedures: No procedures performed   Clinical Data: No additional findings.  Objective: Vital Signs: There were no vitals taken for this visit.  Physical Exam:  Constitutional: Patient appears well-developed HEENT:  Head: Normocephalic Eyes:EOM are normal Neck: Normal range of motion Cardiovascular: Normal rate Pulmonary/chest: Effort normal Neurologic: Patient is alert Skin: Skin is warm Psychiatric: Patient has normal mood and affect  Ortho Exam: Ortho exam demonstrates right shoulder with equivalent range of motion to the contralateral side.  She has crepitus that is felt with passive range of motion of the shoulder in the posterior lateral right shoulder.  No significant tenderness palpation throughout the right shoulder.  5/5 motor strength of the bilateral grip, finger abduction, pronation/supination, bicep, tricep.  5/5 motor strength of the supraspinatus and subscapularis muscles.  Marked weakness of the infraspinatus  compared with the contralateral infraspinatus.  She notes pain is worse with infraspinatus resistance testing.  No tenderness palpation over the acromioclavicular joint.  No tenderness palpation over the bicipital groove.  Negative O'Brien's test.  Negative impingement signs.  No tenderness palpation of the axial cervical spine or pain with cervical range of motion.  Specialty Comments:  No specialty comments available.  Imaging: No results found.   PMFS History: Patient Active Problem List   Diagnosis Date Noted  . Hyperlipidemia 01/12/2020  . GERD  (gastroesophageal reflux disease) 06/27/2014  . Groin mass in female 01/26/2012  . Physical exam 04/13/2011  . Screening for malignant neoplasm of cervix 04/13/2011  . RLS (restless legs syndrome) 04/13/2011  . Breast cancer screening by mammogram 04/13/2011  . PETECHIAE 06/18/2009  . VARICOSE VEINS, LOWER EXTREMITIES 11/01/2008  . Anxiety and depression 10/10/2008  . Osteoporosis 10/10/2008   Past Medical History:  Diagnosis Date  . Groin mass in female   . Osteoporosis     Family History  Problem Relation Age of Onset  . Cancer Mother        breast  . Hypertension Father   . Cancer Father        pancreatic  . Cancer Sister        colon, lung  . Breast cancer Neg Hx     Past Surgical History:  Procedure Laterality Date  . BREAST SURGERY  03/1992   implants  . HERNIA REPAIR    . right groin mass  03/24/12  . TUBAL LIGATION  08/1990   Social History   Occupational History  . Not on file  Tobacco Use  . Smoking status: Current Every Day Smoker    Packs/day: 0.50    Types: Cigarettes  . Smokeless tobacco: Never Used  Substance and Sexual Activity  . Alcohol use: Yes  . Drug use: No  . Sexual activity: Not on file

## 2020-03-03 ENCOUNTER — Encounter: Payer: Self-pay | Admitting: Orthopedic Surgery

## 2020-03-15 ENCOUNTER — Other Ambulatory Visit: Payer: Self-pay

## 2020-03-15 ENCOUNTER — Ambulatory Visit
Admission: RE | Admit: 2020-03-15 | Discharge: 2020-03-15 | Disposition: A | Discharge status: Home / Self Care | Payer: 59 | Source: Ambulatory Visit | Attending: Orthopedic Surgery | Admitting: Orthopedic Surgery

## 2020-03-15 DIAGNOSIS — M25511 Pain in right shoulder: Secondary | ICD-10-CM

## 2020-03-15 DIAGNOSIS — R29898 Other symptoms and signs involving the musculoskeletal system: Secondary | ICD-10-CM

## 2020-03-15 MED ORDER — IOPAMIDOL (ISOVUE-M 200) INJECTION 41%
15.0000 mL | Freq: Once | INTRAMUSCULAR | Status: AC
  Administered 2020-03-15: 15 mL via INTRA_ARTICULAR

## 2020-03-20 ENCOUNTER — Telehealth: Payer: Self-pay | Admitting: Orthopedic Surgery

## 2020-03-20 NOTE — Telephone Encounter (Signed)
Called the patient to schedule her MRI review.   She feels the next available, which is 04/04/2020, is too long to wait. She wanted to know if she could be worked in sooner or given the results over the phone.   Call back: [phone]

## 2020-03-20 NOTE — Telephone Encounter (Signed)
Please advise. Thanks.  

## 2020-03-20 NOTE — Telephone Encounter (Signed)
With results of scan, recommend in-person visit.  I can see her tomorrow or Friday or maybe work her in with Dr. August Saucer next week. Likely a surgical problem

## 2020-03-21 NOTE — Telephone Encounter (Signed)
Scheduled with Dr August Saucer next week

## 2020-03-27 ENCOUNTER — Ambulatory Visit (INDEPENDENT_AMBULATORY_CARE_PROVIDER_SITE_OTHER): Payer: 59 | Admitting: Orthopedic Surgery

## 2020-03-27 ENCOUNTER — Encounter: Payer: Self-pay | Admitting: Orthopedic Surgery

## 2020-03-27 DIAGNOSIS — M75121 Complete rotator cuff tear or rupture of right shoulder, not specified as traumatic: Secondary | ICD-10-CM

## 2020-03-31 ENCOUNTER — Encounter: Payer: Self-pay | Admitting: Orthopedic Surgery

## 2020-03-31 NOTE — Progress Notes (Signed)
Office Visit Note   Patient: Alexa Proctor           Date of Birth: November 09, 1955           MRN: [id number] Visit Date: 03/27/2020 Requested by: Alexa Hatch, MD [address] [place],  [place] [address] PCP: Alexa Hatch, MD  Subjective: Chief Complaint  Patient presents with  . Right Shoulder - Pain    HPI: Alexa Proctor is a 64 year old patient with right shoulder pain.  Since she was last seen she is had an MRI scan which does show 2 and half centimeter retracted rotator cuff tear.  Hurts for her to cook meals as well as take dishes out of the cabinet.  She does have husband at home.  Has a history of osteoporosis but no medication.  No atrophy of the supraspinatus.              ROS: All systems reviewed are negative as they relate to the chief complaint within the history of present illness.  Patient denies  fevers or chills.   Assessment & Plan: Visit Diagnoses:  1. Complete tear of right rotator cuff, unspecified whether traumatic     Plan: Impression is right shoulder rotator cuff tear.  The tear measures about 1 cm with 2 cm of retraction.  No muscle atrophy.  Plan at this time is arthroscopy with biceps tendon release debridement and mini open rotator cuff tear repair and biceps tenodesis.  Risk benefits are discussed along with the rehabilitative process.  Risk include but not limited to infection nerve vessel damage shoulder stiffness incomplete restoration of function as well as the prolonged rehabilitative effort required.  Plan to use passive motion splint versus chair for postop prevention of shoulder stiffness.  Patient understands risk benefits and wishes to proceed.  All questions answered  Follow-Up Instructions: No follow-ups on file.   Orders:  No orders of the defined types were placed in this encounter.  No orders of the defined types were placed in this encounter.     Procedures: No procedures performed   Clinical Data: No additional  findings.  Objective: Vital Signs: There were no vitals taken for this visit.  Physical Exam:   Constitutional: Patient appears well-developed HEENT:  Head: Normocephalic Eyes:EOM are normal Neck: Normal range of motion Cardiovascular: Normal rate Pulmonary/chest: Effort normal Neurologic: Patient is alert Skin: Skin is warm Psychiatric: Patient has normal mood and affect    Ortho Exam: Ortho exam demonstrates full active and passive range of motion of the right shoulder.  Does have supraspinatus weakness on the right compared to the left.  No restriction of external rotation 15 degrees of abduction bilaterally.  No other masses lymphadenopathy or skin changes noted in that right shoulder region.  No AC joint tenderness to direct palpation.  Equivocal impingement signs on the right compared to the left.  Specialty Comments:  No specialty comments available.  Imaging: No results found.   PMFS History: Patient Active Problem List   Diagnosis Date Noted  . Hyperlipidemia 01/12/2020  . GERD (gastroesophageal reflux disease) 06/27/2014  . Groin mass in female 01/26/2012  . Physical exam 04/13/2011  . Screening for malignant neoplasm of cervix 04/13/2011  . RLS (restless legs syndrome) 04/13/2011  . Breast cancer screening by mammogram 04/13/2011  . PETECHIAE 06/18/2009  . VARICOSE VEINS, LOWER EXTREMITIES 11/01/2008  . Anxiety and depression 10/10/2008  . Osteoporosis 10/10/2008   Past Medical History:  Diagnosis Date  .  Groin mass in female   . Osteoporosis     Family History  Problem Relation Age of Onset  . Cancer Mother        breast  . Hypertension Father   . Cancer Father        pancreatic  . Cancer Sister        colon, lung  . Breast cancer Neg Hx     Past Surgical History:  Procedure Laterality Date  . BREAST SURGERY  03/1992   implants  . HERNIA REPAIR    . right groin mass  03/24/12  . TUBAL LIGATION  08/1990   Social History   Occupational  History  . Not on file  Tobacco Use  . Smoking status: Current Every Day Smoker    Packs/day: 0.50    Types: Cigarettes  . Smokeless tobacco: Never Used  Substance and Sexual Activity  . Alcohol use: Yes  . Drug use: No  . Sexual activity: Not on file

## 2020-04-08 ENCOUNTER — Other Ambulatory Visit: Payer: Self-pay

## 2020-04-09 ENCOUNTER — Other Ambulatory Visit: Payer: Self-pay

## 2020-04-09 ENCOUNTER — Ambulatory Visit
Admission: RE | Admit: 2020-04-09 | Discharge: 2020-04-09 | Disposition: A | Discharge status: Home / Self Care | Payer: 59 | Source: Ambulatory Visit | Attending: Family Medicine | Admitting: Family Medicine

## 2020-04-09 DIAGNOSIS — Z1231 Encounter for screening mammogram for malignant neoplasm of breast: Secondary | ICD-10-CM

## 2020-04-09 DIAGNOSIS — M810 Age-related osteoporosis without current pathological fracture: Secondary | ICD-10-CM

## 2020-04-10 ENCOUNTER — Encounter: Payer: Self-pay | Admitting: Family Medicine

## 2020-04-16 ENCOUNTER — Encounter: Payer: Self-pay | Admitting: Orthopedic Surgery

## 2020-04-17 NOTE — Telephone Encounter (Signed)
I called.

## 2020-04-18 ENCOUNTER — Encounter (HOSPITAL_BASED_OUTPATIENT_CLINIC_OR_DEPARTMENT_OTHER): Payer: Self-pay | Admitting: Orthopedic Surgery

## 2020-04-18 ENCOUNTER — Other Ambulatory Visit: Payer: Self-pay

## 2020-04-22 ENCOUNTER — Other Ambulatory Visit (HOSPITAL_COMMUNITY)
Admission: RE | Admit: 2020-04-22 | Discharge: 2020-04-22 | Disposition: A | Discharge status: Home / Self Care | Payer: 59 | Source: Ambulatory Visit | Attending: Orthopedic Surgery | Admitting: Orthopedic Surgery

## 2020-04-22 DIAGNOSIS — Z20822 Contact with and (suspected) exposure to covid-19: Secondary | ICD-10-CM | POA: Diagnosis not present

## 2020-04-22 DIAGNOSIS — Z01812 Encounter for preprocedural laboratory examination: Secondary | ICD-10-CM | POA: Diagnosis present

## 2020-04-23 LAB — SARS CORONAVIRUS 2 (TAT 6-24 HRS): SARS Coronavirus 2: NEGATIVE

## 2020-04-24 ENCOUNTER — Encounter (HOSPITAL_BASED_OUTPATIENT_CLINIC_OR_DEPARTMENT_OTHER)
Admission: RE | Admit: 2020-04-24 | Discharge: 2020-04-24 | Disposition: A | Discharge status: Home / Self Care | Payer: 59 | Source: Ambulatory Visit | Attending: Orthopedic Surgery | Admitting: Orthopedic Surgery

## 2020-04-24 DIAGNOSIS — X58XXXA Exposure to other specified factors, initial encounter: Secondary | ICD-10-CM | POA: Diagnosis not present

## 2020-04-24 DIAGNOSIS — Z87891 Personal history of nicotine dependence: Secondary | ICD-10-CM | POA: Diagnosis not present

## 2020-04-24 DIAGNOSIS — S43431A Superior glenoid labrum lesion of right shoulder, initial encounter: Secondary | ICD-10-CM | POA: Diagnosis not present

## 2020-04-24 DIAGNOSIS — M7521 Bicipital tendinitis, right shoulder: Secondary | ICD-10-CM | POA: Diagnosis not present

## 2020-04-24 DIAGNOSIS — Z882 Allergy status to sulfonamides status: Secondary | ICD-10-CM | POA: Diagnosis not present

## 2020-04-24 DIAGNOSIS — Z888 Allergy status to other drugs, medicaments and biological substances status: Secondary | ICD-10-CM | POA: Diagnosis not present

## 2020-04-24 DIAGNOSIS — M75101 Unspecified rotator cuff tear or rupture of right shoulder, not specified as traumatic: Secondary | ICD-10-CM | POA: Diagnosis not present

## 2020-04-24 DIAGNOSIS — F329 Major depressive disorder, single episode, unspecified: Secondary | ICD-10-CM | POA: Diagnosis not present

## 2020-04-24 DIAGNOSIS — Z79899 Other long term (current) drug therapy: Secondary | ICD-10-CM | POA: Diagnosis not present

## 2020-04-24 LAB — BASIC METABOLIC PANEL
Anion gap: 9 (ref 5–15)
BUN: 13 mg/dL (ref 8–23)
CO2: 26 mmol/L (ref 22–32)
Calcium: 9.4 mg/dL (ref 8.9–10.3)
Chloride: 105 mmol/L (ref 98–111)
Creatinine, Ser: 0.99 mg/dL (ref 0.44–1.00)
GFR calc Af Amer: >60 mL/min (ref >60)
GFR calc non Af Amer: >60 mL/min (ref >60)
Glucose, Bld: 96 mg/dL (ref 70–99)
Potassium: 3.9 mmol/L (ref 3.5–5.1)
Sodium: 140 mmol/L (ref 135–145)

## 2020-04-24 LAB — CBC
HCT: 42.7 % (ref 36.0–46.0)
Hemoglobin: 14.1 g/dL (ref 12.0–15.0)
MCH: 31.8 pg (ref 26.0–34.0)
MCHC: 33 g/dL (ref 30.0–36.0)
MCV: 96.4 fL (ref 80.0–100.0)
Platelets: 250 10*3/uL (ref 150–400)
RBC: 4.43 MIL/uL (ref 3.87–5.11)
RDW: 12.2 % (ref 11.5–15.5)
WBC: 4 10*3/uL (ref 4.0–10.5)
nRBC: 0 % (ref 0.0–0.2)

## 2020-04-24 NOTE — Progress Notes (Signed)

## 2020-04-25 ENCOUNTER — Ambulatory Visit (HOSPITAL_BASED_OUTPATIENT_CLINIC_OR_DEPARTMENT_OTHER): Payer: 59 | Admitting: Certified Registered Nurse Anesthetist

## 2020-04-25 ENCOUNTER — Encounter (HOSPITAL_BASED_OUTPATIENT_CLINIC_OR_DEPARTMENT_OTHER)
Admission: RE | Disposition: A | Discharge status: Home / Self Care | Payer: Self-pay | Source: Home / Self Care | Attending: Orthopedic Surgery

## 2020-04-25 ENCOUNTER — Encounter (HOSPITAL_BASED_OUTPATIENT_CLINIC_OR_DEPARTMENT_OTHER): Payer: Self-pay | Admitting: Orthopedic Surgery

## 2020-04-25 ENCOUNTER — Ambulatory Visit (HOSPITAL_BASED_OUTPATIENT_CLINIC_OR_DEPARTMENT_OTHER)
Admission: RE | Admit: 2020-04-25 | Discharge: 2020-04-25 | Disposition: A | Discharge status: Home / Self Care | Payer: 59 | Attending: Orthopedic Surgery | Admitting: Orthopedic Surgery

## 2020-04-25 ENCOUNTER — Other Ambulatory Visit: Payer: Self-pay

## 2020-04-25 DIAGNOSIS — F329 Major depressive disorder, single episode, unspecified: Secondary | ICD-10-CM | POA: Insufficient documentation

## 2020-04-25 DIAGNOSIS — S43431A Superior glenoid labrum lesion of right shoulder, initial encounter: Secondary | ICD-10-CM | POA: Diagnosis not present

## 2020-04-25 DIAGNOSIS — M75101 Unspecified rotator cuff tear or rupture of right shoulder, not specified as traumatic: Secondary | ICD-10-CM | POA: Diagnosis not present

## 2020-04-25 DIAGNOSIS — Z79899 Other long term (current) drug therapy: Secondary | ICD-10-CM | POA: Insufficient documentation

## 2020-04-25 DIAGNOSIS — M7521 Bicipital tendinitis, right shoulder: Secondary | ICD-10-CM | POA: Insufficient documentation

## 2020-04-25 DIAGNOSIS — Z882 Allergy status to sulfonamides status: Secondary | ICD-10-CM | POA: Insufficient documentation

## 2020-04-25 DIAGNOSIS — M75121 Complete rotator cuff tear or rupture of right shoulder, not specified as traumatic: Secondary | ICD-10-CM

## 2020-04-25 DIAGNOSIS — Z888 Allergy status to other drugs, medicaments and biological substances status: Secondary | ICD-10-CM | POA: Insufficient documentation

## 2020-04-25 DIAGNOSIS — X58XXXA Exposure to other specified factors, initial encounter: Secondary | ICD-10-CM | POA: Insufficient documentation

## 2020-04-25 DIAGNOSIS — Z87891 Personal history of nicotine dependence: Secondary | ICD-10-CM | POA: Insufficient documentation

## 2020-04-25 HISTORY — DX: Depression, unspecified: F32.A

## 2020-04-25 HISTORY — PX: SHOULDER ARTHROSCOPY WITH ROTATOR CUFF REPAIR AND OPEN BICEPS TENODESIS: SHX6677

## 2020-04-25 SURGERY — SHOULDER ARTHROSCOPY WITH ROTATOR CUFF REPAIR AND OPEN BICEPS TENODESIS
Anesthesia: General | Site: Shoulder | Laterality: Right

## 2020-04-25 MED ORDER — HYDROMORPHONE HCL 1 MG/ML IJ SOLN: 0.2500 mg | INTRAMUSCULAR | Status: DC | PRN

## 2020-04-25 MED ORDER — POVIDONE-IODINE 7.5 % EX SOLN: Freq: Once | CUTANEOUS | Status: DC

## 2020-04-25 MED ORDER — AMISULPRIDE (ANTIEMETIC) 5 MG/2ML IV SOLN
10.0000 mg | Freq: Once | INTRAVENOUS | Status: AC | PRN
  Administered 2020-04-25: 10 mg via INTRAVENOUS

## 2020-04-25 MED ORDER — LIDOCAINE-EPINEPHRINE 1 %-1:100000 IJ SOLN
INTRAMUSCULAR | Status: AC
  Filled 2020-04-25: qty 1

## 2020-04-25 MED ORDER — BUPIVACAINE HCL (PF) 0.5 % IJ SOLN
INTRAMUSCULAR | Status: AC
  Filled 2020-04-25: qty 30

## 2020-04-25 MED ORDER — ONDANSETRON HCL 4 MG/2ML IJ SOLN
INTRAMUSCULAR | Status: DC | PRN
  Administered 2020-04-25: 4 mg via INTRAVENOUS

## 2020-04-25 MED ORDER — ONDANSETRON HCL 4 MG/2ML IJ SOLN
INTRAMUSCULAR | Status: AC
  Filled 2020-04-25: qty 2

## 2020-04-25 MED ORDER — CEFAZOLIN SODIUM-DEXTROSE 2-4 GM/100ML-% IV SOLN
INTRAVENOUS | Status: AC
  Filled 2020-04-25: qty 100

## 2020-04-25 MED ORDER — BUPIVACAINE HCL (PF) 0.5 % IJ SOLN
INTRAMUSCULAR | Status: DC | PRN
  Administered 2020-04-25: 20 mL via PERINEURAL

## 2020-04-25 MED ORDER — PHENYLEPHRINE HCL-NACL 10-0.9 MG/250ML-% IV SOLN
INTRAVENOUS | Status: DC | PRN
  Administered 2020-04-25: 40 ug/min via INTRAVENOUS

## 2020-04-25 MED ORDER — IBUPROFEN 800 MG PO TABS: 800.0000 mg | ORAL_TABLET | Freq: Three times a day (TID) | ORAL | 0 refills | Status: DC | PRN

## 2020-04-25 MED ORDER — EPHEDRINE SULFATE 50 MG/ML IJ SOLN
INTRAMUSCULAR | Status: DC | PRN
  Administered 2020-04-25: 5 mg via INTRAVENOUS
  Administered 2020-04-25 (×3): 10 mg via INTRAVENOUS

## 2020-04-25 MED ORDER — EPHEDRINE 5 MG/ML INJ
INTRAVENOUS | Status: AC
  Filled 2020-04-25: qty 10

## 2020-04-25 MED ORDER — FENTANYL CITRATE (PF) 100 MCG/2ML IJ SOLN
INTRAMUSCULAR | Status: AC
  Filled 2020-04-25: qty 2

## 2020-04-25 MED ORDER — LACTATED RINGERS IV SOLN: INTRAVENOUS | Status: DC

## 2020-04-25 MED ORDER — POVIDONE-IODINE 10 % EX SWAB: 2.0000 "application " | Freq: Once | CUTANEOUS | Status: DC

## 2020-04-25 MED ORDER — PHENYLEPHRINE 40 MCG/ML (10ML) SYRINGE FOR IV PUSH (FOR BLOOD PRESSURE SUPPORT)
PREFILLED_SYRINGE | INTRAVENOUS | Status: AC
  Filled 2020-04-25: qty 10

## 2020-04-25 MED ORDER — PHENYLEPHRINE HCL (PRESSORS) 10 MG/ML IV SOLN
INTRAVENOUS | Status: DC | PRN
  Administered 2020-04-25: 120 ug via INTRAVENOUS
  Administered 2020-04-25: 80 ug via INTRAVENOUS
  Administered 2020-04-25: 120 ug via INTRAVENOUS

## 2020-04-25 MED ORDER — HYDROCODONE-ACETAMINOPHEN 5-325 MG PO TABS
1.0000 | ORAL_TABLET | Freq: Every day | ORAL | 0 refills | Status: DC | PRN
Start: 2020-04-25 — End: 2021-01-16

## 2020-04-25 MED ORDER — TRAMADOL HCL 50 MG PO TABS: 50.0000 mg | ORAL_TABLET | Freq: Four times a day (QID) | ORAL | 0 refills | Status: DC | PRN

## 2020-04-25 MED ORDER — PROPOFOL 10 MG/ML IV BOLUS
INTRAVENOUS | Status: DC | PRN
  Administered 2020-04-25: 200 mg via INTRAVENOUS

## 2020-04-25 MED ORDER — PROPOFOL 10 MG/ML IV BOLUS
INTRAVENOUS | Status: AC
  Filled 2020-04-25: qty 20

## 2020-04-25 MED ORDER — DEXAMETHASONE SODIUM PHOSPHATE 10 MG/ML IJ SOLN
INTRAMUSCULAR | Status: DC | PRN
  Administered 2020-04-25: 4 mg via INTRAVENOUS

## 2020-04-25 MED ORDER — OXYCODONE HCL 5 MG/5ML PO SOLN: 5.0000 mg | Freq: Once | ORAL | Status: DC | PRN

## 2020-04-25 MED ORDER — MIDAZOLAM HCL 2 MG/2ML IJ SOLN
INTRAMUSCULAR | Status: AC
  Filled 2020-04-25: qty 2

## 2020-04-25 MED ORDER — EPINEPHRINE PF 1 MG/ML IJ SOLN
INTRAMUSCULAR | Status: AC
  Filled 2020-04-25: qty 1

## 2020-04-25 MED ORDER — BUPIVACAINE LIPOSOME 1.3 % IJ SUSP
INTRAMUSCULAR | Status: DC | PRN
  Administered 2020-04-25: 10 mL via PERINEURAL

## 2020-04-25 MED ORDER — AMISULPRIDE (ANTIEMETIC) 5 MG/2ML IV SOLN
INTRAVENOUS | Status: AC
  Filled 2020-04-25: qty 4

## 2020-04-25 MED ORDER — OXYCODONE HCL 5 MG PO TABS: 5.0000 mg | ORAL_TABLET | Freq: Once | ORAL | Status: DC | PRN

## 2020-04-25 MED ORDER — FENTANYL CITRATE (PF) 100 MCG/2ML IJ SOLN
50.0000 ug | Freq: Once | INTRAMUSCULAR | Status: AC
  Administered 2020-04-25: 50 ug via INTRAVENOUS

## 2020-04-25 MED ORDER — PROMETHAZINE HCL 25 MG/ML IJ SOLN
6.2500 mg | INTRAMUSCULAR | Status: AC | PRN
  Administered 2020-04-25 (×2): 6.25 mg via INTRAVENOUS

## 2020-04-25 MED ORDER — MIDAZOLAM HCL 2 MG/2ML IJ SOLN
2.0000 mg | Freq: Once | INTRAMUSCULAR | Status: AC
  Administered 2020-04-25: 2 mg via INTRAVENOUS

## 2020-04-25 MED ORDER — MEPERIDINE HCL 25 MG/ML IJ SOLN: 6.2500 mg | INTRAMUSCULAR | Status: DC | PRN

## 2020-04-25 MED ORDER — PROMETHAZINE HCL 25 MG/ML IJ SOLN
INTRAMUSCULAR | Status: AC
  Filled 2020-04-25: qty 1

## 2020-04-25 MED ORDER — CEFAZOLIN SODIUM-DEXTROSE 2-4 GM/100ML-% IV SOLN
2.0000 g | INTRAVENOUS | Status: AC
  Administered 2020-04-25: 2 g via INTRAVENOUS

## 2020-04-25 MED ORDER — DEXAMETHASONE SODIUM PHOSPHATE 10 MG/ML IJ SOLN
INTRAMUSCULAR | Status: AC
  Filled 2020-04-25: qty 1

## 2020-04-25 MED ORDER — LIDOCAINE 2% (20 MG/ML) 5 ML SYRINGE
INTRAMUSCULAR | Status: AC
  Filled 2020-04-25: qty 5

## 2020-04-25 SURGICAL SUPPLY — 98 items
AID PSTN UNV HD RSTRNT DISP (MISCELLANEOUS) ×1
ANCH SUT CRKSW FT 1.3X (Anchor) ×2 IMPLANT
ANCH SUT FBRTK 1.3 2 TPE (Anchor) ×1 IMPLANT
ANCH SUT SWLK 19.1X5.5 CLS EL (Anchor) ×2 IMPLANT
ANCHOR FBRTK 2.6 SUTURETAP 1.3 (Anchor) ×2 IMPLANT
ANCHOR PEEK SWIVEL LOCK 5.5 (Anchor) ×4 IMPLANT
ANCHOR SUT 1.8 FBRTK KNTLS 2SU (Anchor) ×4 IMPLANT
ANCHOR SUT BIOCOMP CORKSREW (Anchor) ×4 IMPLANT
BLADE EXCALIBUR 4.0MM X 13CM (MISCELLANEOUS)
BLADE EXCALIBUR 4.0X13 (MISCELLANEOUS) IMPLANT
BLADE SHAVER BONE 5.0MM X 13CM (MISCELLANEOUS)
BLADE SHAVER BONE 5.0X13 (MISCELLANEOUS) IMPLANT
BLADE SURG 10 STRL SS (BLADE) IMPLANT
BLADE SURG 15 STRL LF DISP TIS (BLADE) IMPLANT
BLADE SURG 15 STRL SS (BLADE) ×3
BURR OVAL 8 FLU 5.0MM X 13CM (MISCELLANEOUS)
BURR OVAL 8 FLU 5.0X13 (MISCELLANEOUS) IMPLANT
CANNULA 5.75X71 LONG (CANNULA) IMPLANT
CANNULA TWIST IN 8.25X7CM (CANNULA) IMPLANT
COOLER ICEMAN CLASSIC (MISCELLANEOUS) ×2 IMPLANT
COVER WAND RF STERILE (DRAPES) IMPLANT
DECANTER SPIKE VIAL GLASS SM (MISCELLANEOUS) IMPLANT
DISSECTOR  3.8MM X 13CM (MISCELLANEOUS)
DISSECTOR 3.8MM X 13CM (MISCELLANEOUS) IMPLANT
DISSECTOR 4.0MM X 13CM (MISCELLANEOUS) ×3 IMPLANT
DRAPE IMP U-DRAPE 54X76 (DRAPES) ×3 IMPLANT
DRAPE INCISE IOBAN 66X45 STRL (DRAPES) ×6 IMPLANT
DRAPE STERI 35X30 U-POUCH (DRAPES) ×3 IMPLANT
DRAPE U-SHAPE 47X51 STRL (DRAPES) ×6 IMPLANT
DRAPE U-SHAPE 76X120 STRL (DRAPES) ×6 IMPLANT
DRSG AQUACEL AG ADV 3.5X 6 (GAUZE/BANDAGES/DRESSINGS) ×2 IMPLANT
DURAPREP 26ML APPLICATOR (WOUND CARE) ×3 IMPLANT
DW OUTFLOW CASSETTE/TUBE SET (MISCELLANEOUS) ×3 IMPLANT
ELECT REM PT RETURN 9FT ADLT (ELECTROSURGICAL) ×3
ELECTRODE REM PT RTRN 9FT ADLT (ELECTROSURGICAL) ×1 IMPLANT
EXCALIBUR 3.8MM X 13CM (MISCELLANEOUS) IMPLANT
GAUZE SPONGE 4X4 12PLY STRL (GAUZE/BANDAGES/DRESSINGS) ×3 IMPLANT
GAUZE XEROFORM 1X8 LF (GAUZE/BANDAGES/DRESSINGS) ×3 IMPLANT
GLOVE BIO SURGEON STRL SZ7 (GLOVE) ×5 IMPLANT
GLOVE BIOGEL PI IND STRL 7.0 (GLOVE) ×1 IMPLANT
GLOVE BIOGEL PI IND STRL 8 (GLOVE) ×1 IMPLANT
GLOVE BIOGEL PI INDICATOR 7.0 (GLOVE) ×4
GLOVE BIOGEL PI INDICATOR 8 (GLOVE) ×2
GLOVE SURG ORTHO 8.0 STRL STRW (GLOVE) ×3 IMPLANT
GOWN STRL REUS W/ TWL LRG LVL3 (GOWN DISPOSABLE) ×2 IMPLANT
GOWN STRL REUS W/ TWL XL LVL3 (GOWN DISPOSABLE) ×1 IMPLANT
GOWN STRL REUS W/TWL LRG LVL3 (GOWN DISPOSABLE) ×6
GOWN STRL REUS W/TWL XL LVL3 (GOWN DISPOSABLE) ×3
KIT BIO-TENODESIS 3X8 DISP (MISCELLANEOUS)
KIT INSRT BABSR STRL DISP BTN (MISCELLANEOUS) IMPLANT
KIT STR SPEAR 1.8 FBRTK DISP (KITS) ×2 IMPLANT
MANIFOLD NEPTUNE II (INSTRUMENTS) ×3 IMPLANT
NDL SAFETY ECLIPSE 18X1.5 (NEEDLE) ×1 IMPLANT
NDL SCORPION MULTI FIRE (NEEDLE) IMPLANT
NDL SPNL 18GX3.5 QUINCKE PK (NEEDLE) IMPLANT
NDL SUT 6 .5 CRC .975X.05 MAYO (NEEDLE) IMPLANT
NEEDLE HYPO 18GX1.5 SHARP (NEEDLE) ×3
NEEDLE MAYO TAPER (NEEDLE) ×3
NEEDLE SCORPION MULTI FIRE (NEEDLE) ×3 IMPLANT
NEEDLE SPNL 18GX3.5 QUINCKE PK (NEEDLE) IMPLANT
NS IRRIG 1000ML POUR BTL (IV SOLUTION) IMPLANT
PACK ARTHROSCOPY DSU (CUSTOM PROCEDURE TRAY) ×3 IMPLANT
PACK BASIN DAY SURGERY FS (CUSTOM PROCEDURE TRAY) ×3 IMPLANT
PAD COLD SHLDR WRAP-ON (PAD) ×2 IMPLANT
PENCIL SMOKE EVACUATOR (MISCELLANEOUS) ×3 IMPLANT
PORT APPOLLO RF 90DEGREE MULTI (SURGICAL WAND) ×3 IMPLANT
RESTRAINT HEAD UNIVERSAL NS (MISCELLANEOUS) ×3 IMPLANT
SHEET MEDIUM DRAPE 40X70 STRL (DRAPES) IMPLANT
SLEEVE SCD COMPRESS KNEE MED (MISCELLANEOUS) ×3 IMPLANT
SLING ARM FOAM STRAP LRG (SOFTGOODS) IMPLANT
SLING ARM IMMOBILIZER MED (SOFTGOODS) ×2 IMPLANT
SPONGE LAP 4X18 RFD (DISPOSABLE) ×2 IMPLANT
SPONGE SURGIFOAM ABS GEL 12-7 (HEMOSTASIS) IMPLANT
SUCTION FRAZIER HANDLE 10FR (MISCELLANEOUS) ×3
SUCTION TUBE FRAZIER 10FR DISP (MISCELLANEOUS) ×1 IMPLANT
SUT BONE WAX W31G (SUTURE) IMPLANT
SUT ETHIBOND 2 OS 4 DA (SUTURE) IMPLANT
SUT ETHILON 3 0 PS 1 (SUTURE) ×3 IMPLANT
SUT FIBERWIRE #2 38 T-5 BLUE (SUTURE)
SUT FIBERWIRE 2-0 18 17.9 3/8 (SUTURE) ×3
SUT MNCRL AB 3-0 PS2 18 (SUTURE) ×3 IMPLANT
SUT PDS AB 0 CT 36 (SUTURE) IMPLANT
SUT TICRON 1 T 12 (SUTURE) IMPLANT
SUT VIC AB 1 CT1 27 (SUTURE) ×6
SUT VIC AB 1 CT1 27XBRD ANBCTR (SUTURE) IMPLANT
SUT VIC AB 2-0 SH 27 (SUTURE)
SUT VIC AB 2-0 SH 27XBRD (SUTURE) IMPLANT
SUT VICRYL 0 UR6 27IN ABS (SUTURE) ×8 IMPLANT
SUT VICRYL 4-0 PS2 18IN ABS (SUTURE) ×3 IMPLANT
SUTURE FIBERWR #2 38 T-5 BLUE (SUTURE) IMPLANT
SUTURE FIBERWR 2-0 18 17.9 3/8 (SUTURE) IMPLANT
SYR 5ML LL (SYRINGE) ×2 IMPLANT
TOWEL GREEN STERILE FF (TOWEL DISPOSABLE) ×3 IMPLANT
TRAY DSU PREP LF (CUSTOM PROCEDURE TRAY) ×3 IMPLANT
TUBE CONNECTING 20'X1/4 (TUBING) ×1
TUBE CONNECTING 20X1/4 (TUBING) ×2 IMPLANT
TUBING ARTHROSCOPY IRRIG 16FT (MISCELLANEOUS) ×3 IMPLANT
YANKAUER SUCT BULB TIP NO VENT (SUCTIONS) ×3 IMPLANT

## 2020-04-25 NOTE — Anesthesia Postprocedure Evaluation (Signed)
Anesthesia Post Note  Patient: Alexa Proctor  Procedure(s) Performed: RIGHT SHOULDER ARTHROSCOPY, DEBRIDEMENT, MINI OPEN ROTATOR CUFF TEAR REPAIR AND OPEN BICEPS TENODESIS (Right Shoulder)     Patient location during evaluation: PACU Anesthesia Type: General Level of consciousness: awake and alert Pain management: pain level controlled Vital Signs Assessment: post-procedure vital signs reviewed and stable Respiratory status: spontaneous breathing, nonlabored ventilation, respiratory function stable and patient connected to nasal cannula oxygen Cardiovascular status: blood pressure returned to baseline and stable Postop Assessment: no apparent nausea or vomiting Anesthetic complications: no   No complications documented.  Last Vitals:  Vitals:   04/25/20 1651 04/25/20 1730  BP: 119/75   Pulse: 82 84  Resp: 14 16  Temp: 36.4 C   SpO2: 98% 96%    Last Pain:  Vitals:   04/25/20 1651  TempSrc:   PainSc: 0-No pain                 Earl Lites P Safiyya Stokes

## 2020-04-25 NOTE — Op Note (Signed)
Alexa Proctor, Alexa Proctor MEDICAL RECORD [id number] ACCOUNT [id number] DATE OF BIRTH:12-Feb-1956 FACILITY: [facility] LOCATION: [facility] PHYSICIAN:Alexa Proctor Diamantina Providence, MD  OPERATIVE REPORT  DATE OF PROCEDURE:  04/25/2020  PREOPERATIVE DIAGNOSIS:  Right shoulder biceps tendinitis, rotator cuff tear and bursitis.  POSTOPERATIVE DIAGNOSIS:  Right shoulder biceps tendinitis, rotator cuff tear and bursitis.  PROCEDURE:   1.  Right shoulder arthroscopy with limited debridement of the superior labrum with release of the biceps tendon, which was adherent to the rotator interval and undersurface of the rotator cuff. 2.  Mini open rotator cuff tear repair. 3.  Biceps tenodesis of supraspinatus tear, which measured about 2 x 2 cm, V-shaped pattern.  SURGEON:  Cammy Copa, MD  ASSISTANT:  Alexa Cai, MD   INDICATIONS:  The patient is a 64 year old patient with right shoulder pain, who presents for operative management of rotator cuff tear and biceps tendinitis after explanation of risks and benefits.  PROCEDURE IN DETAIL:  The patient was brought to the operating room where general endotracheal anesthesia was induced.  Preoperative antibiotics administered.  Timeout was called.  The patient was placed in the beach chair position with the head in  neutral position.  Right arm, shoulder and hand prescrubbed with alcohol and Betadine, allowed to air dry, prepped with DuraPrep solution and draped in sterile manner.  Alexa Proctor was used to seal the operative field.  Timeout was called.  A posterior portal  was created 2 cm medial and inferior to the posterolateral margin of the acromion.  Diagnostic arthroscopy was performed.  Anterior portal created under direct visualization.  The glenohumeral articular surfaces were intact.  The anteroinferior,  posteroinferior glenohumeral ligaments were intact.  Rotator cuff tear was visualized, which was more of a degenerative type tear of the supraspinatus.  Biceps was  adherent to the rotator interval and undersurface of the cuff.  Under direct  visualization, the anterior portal was created and that adhesed region was developed and the biceps was freed.  The patient did have degenerative SLAP tear.  Arthrocare wand was used to debride the superior labrum and release the biceps tendon.  After  this, the instruments were removed.  Portals were closed using 3-0 nylon.  Ioban then used to cover the entire operative field.  An incision off the anterolateral margin of the acromion was made.  Skin and subcutaneous tissue were sharply divided.   Deltoid split between the anterior middle raphae was made and marked with a #1 Vicryl suture about 4 cm from the anterolateral margin.  The tissue was retracted.  A bursectomy performed.  Biceps tendon was tenodesed using 2 Arthrex knotless SutureTaks,  reinforced with one 0 Vicryl suture through the transverse humeral ligament.  This was done under appropriate tension.  Following this, attention was directed towards the rotator cuff tear.  This was more of a degenerative type tear.  The subscapularis  was intact.  The acromiohumeral ligament was partially divided.  The small spur off the anterolateral acromion was rasped.  Next, attention was directed towards the rotator cuff tear.  Degenerative portions of the rotator cuff were debrided.  This left  essentially primarily posterior tear of the supraspinatus which had retracted posteriorly.  Some small portion of the anterior part of the supraspinatus remained intact.  The tear was mobilized.  Stay sutures were placed, 0 Vicryl suture on both sides of  the V-shaped tear.  The landing zone on the tuberosity was prepared.  A 5.5 corkscrew was then placed posteriorly.  One was  also placed anteriorly.  In this manner using the Scorpion, 4 sutures were passed on each side of this flap.  These sutures were  tied and then crossed, along with the Vicryl sutures.  They were placed in the 2  [place]ocks anterior and posterior on the metaphyseal region.  This gave a watertight repair.  Thorough irrigation was then performed.  Deltoid split was closed using #1  Vicryl suture, followed by interrupted inverted 0 Vicryl suture, 2-0 Vicryl suture, 3-0 Monocryl, and Steri-Strips.  Impervious dressing was placed.  Shoulder immobilizer placed.  Alexa Proctor's assistance was required at all times for the case for retraction,  opening and closing, mobilization of tissues.  His assistance was a medical necessity.  VN/NUANCE  D:04/25/2020 T:04/25/2020 JOB:[id number]

## 2020-04-25 NOTE — H&P (Signed)
Alexa Proctor is an 64 y.o. female.   Chief Complaint: Right shoulder pain HPI: Alexa Proctor is a 64 year old patient with several month history of right shoulder pain.  Atraumatic onset but affects her activities of daily living.  MRI scan shows retracted rotator cuff tear supraspinatus with no muscle atrophy but about 2 and half centimeters of retraction.  Patient presents now for operative management after estimation of risk and benefits.  She has failed conservative management.  Past Medical History:  Diagnosis Date  . Depression   . Groin mass in female   . Osteoporosis     Past Surgical History:  Procedure Laterality Date  . BREAST SURGERY  03/1992   implants  . HERNIA REPAIR    . right groin mass  03/24/12  . TUBAL LIGATION  08/1990    Family History  Problem Relation Age of Onset  . Cancer Mother        breast  . Hypertension Father   . Cancer Father        pancreatic  . Cancer Sister        colon, lung  . Breast cancer Neg Hx    Social History:  reports that she has quit smoking. Her smoking use included cigarettes. She has never used smokeless tobacco. She reports previous alcohol use. She reports that she does not use drugs.  Allergies:  Allergies  Allergen Reactions  . Soma [Carisoprodol]     Made patient feel "loopy, weird"  . Sulfonamide Derivatives     Per patient happened in childhood, does not know reaction.    Medications Prior to Admission  Medication Sig Dispense Refill  . buPROPion (WELLBUTRIN XL) 300 MG 24 hr tablet Take 1 tablet (300 mg total) by mouth daily. 90 tablet 1  . cetirizine (ZYRTEC) 10 MG tablet Take 10 mg by mouth daily.    . Multiple Vitamin (MULTIVITAMIN) tablet Take 1 tablet by mouth daily.    . NON FORMULARY CBD oil      Results for orders placed or performed during the hospital encounter of 04/25/20 (from the past 48 hour(s))  CBC     Status: None   Collection Time: 04/24/20  2:30 PM  Result Value Ref Range   WBC 4.0 4.0 - 10.5 K/uL    RBC 4.43 3.87 - 5.11 MIL/uL   Hemoglobin 14.1 12.0 - 15.0 g/dL   HCT 46.6 36 - 46 %   MCV 96.4 80.0 - 100.0 fL   MCH 31.8 26.0 - 34.0 pg   MCHC 33.0 30.0 - 36.0 g/dL   RDW 59.9 35.7 - 01.7 %   Platelets 250 150 - 400 K/uL   nRBC 0.0 0.0 - 0.2 %    Comment: Performed at [facility], [address], [place], [place] [address]  Basic metabolic panel     Status: None   Collection Time: 04/24/20  2:30 PM  Result Value Ref Range   Sodium 140 135 - 145 mmol/L   Potassium 3.9 3.5 - 5.1 mmol/L   Chloride 105 98 - 111 mmol/L   CO2 26 22 - 32 mmol/L   Glucose, Bld 96 70 - 99 mg/dL    Comment: Glucose reference range applies only to samples taken after fasting for at least 8 hours.   BUN 13 8 - 23 mg/dL   Creatinine, Ser 3.00 0.44 - 1.00 mg/dL   Calcium 9.4 8.9 - 92.3 mg/dL   GFR calc non Af Amer >60 >60 mL/min  GFR calc Af Amer >60 >60 mL/min   Anion gap 9 5 - 15    Comment: Performed at [facility], [address], [place], [place] [address]   No results found.  Review of Systems  Musculoskeletal: Positive for arthralgias.  All other systems reviewed and are negative.   Blood pressure 96/64, pulse 61, temperature 98.4 F (36.9 C), temperature source Oral, resp. rate 10, height 5\' 2"  (1.575 m), weight 50.3 kg, SpO2 96 %. Physical Exam Vitals reviewed.  HENT:     Head: Normocephalic.     Nose: Nose normal.     Mouth/Throat:     Mouth: Mucous membranes are moist.  Eyes:     Pupils: Pupils are equal, round, and reactive to light.  Cardiovascular:     Rate and Rhythm: Normal rate.     Pulses: Normal pulses.  Pulmonary:     Effort: Pulmonary effort is normal.  Abdominal:     General: Abdomen is flat.  Musculoskeletal:     Cervical back: Normal range of motion.  Skin:    General: Skin is warm.     Capillary Refill: Capillary refill takes less than 2 seconds.  Neurological:     General: No focal deficit present.     Mental Status: She is alert.   Psychiatric:        Mood and Affect: Mood normal.   Examination of the right arm demonstrates mild supraspinatus weakness but maintained passive range of motion.  Slight coarseness with internal/external rotation 9 degrees of abduction.  No AC joint tenderness to direct palpation.  O'Brien's testing equivocal.  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  Plan is arthroscopy with debridement of the superior labrum biceps tendon release and biceps tenodesis and mini open rotator cuff tear repair.  Risk benefits are discussed include not limited to infection nerve vessel damage shoulder stiffness prolonged recovery as well as potential incomplete pain relief and incomplete restoration of function.  Patient understands the risk benefits and wishes to proceed.  All questions answered.  , MD 04/25/2020, 12:13 PM

## 2020-04-25 NOTE — Anesthesia Procedure Notes (Addendum)
Anesthesia Regional Block: Interscalene brachial plexus block   Pre-Anesthetic Checklist: ,, timeout performed, Correct Patient, Correct Site, Correct Laterality, Correct Procedure, Correct Position, site marked, Risks and benefits discussed,  Surgical consent,  Pre-op evaluation,  At surgeon's request and post-op pain management  Laterality: Right  Prep: chloraprep       Needles:  Injection technique: Single-shot  Needle Type: Stimiplex     Needle Length: 9cm  Needle Gauge: 21     Additional Needles:   Procedures:,,,, ultrasound used (permanent image in chart),,,,  Narrative:  Start time: 04/25/2020 11:26 AM End time: 04/25/2020 11:31 AM Injection made incrementally with aspirations every 5 mL.  Performed by: Personally  Anesthesiologist: Lowella Curb, MD

## 2020-04-25 NOTE — Anesthesia Preprocedure Evaluation (Signed)
Anesthesia Evaluation  Patient identified by MRN, date of birth, ID band Patient awake    Reviewed: Allergy & Precautions, NPO status , Patient's Chart, lab work & pertinent test results  Airway Mallampati: II  TM Distance: >3 FB Neck ROM: Full    Dental no notable dental hx.    Pulmonary neg pulmonary ROS, former smoker,    Pulmonary exam normal breath sounds clear to auscultation       Cardiovascular negative cardio ROS Normal cardiovascular exam Rhythm:Regular Rate:Normal     Neuro/Psych Anxiety Depression negative neurological ROS  negative psych ROS   GI/Hepatic Neg liver ROS, GERD  ,  Endo/Other  negative endocrine ROS  Renal/GU negative Renal ROS  negative genitourinary   Musculoskeletal negative musculoskeletal ROS (+)   Abdominal   Peds negative pediatric ROS (+)  Hematology negative hematology ROS (+)   Anesthesia Other Findings   Reproductive/Obstetrics negative OB ROS                             Anesthesia Physical Anesthesia Plan  ASA: II  Anesthesia Plan: General   Post-op Pain Management:  Regional for Post-op pain   Induction: Intravenous  PONV Risk Score and Plan: 3 and Ondansetron, Dexamethasone, Midazolam and Treatment may vary due to age or medical condition  Airway Management Planned: LMA  Additional Equipment:   Intra-op Plan:   Post-operative Plan: Extubation in OR  Informed Consent: I have reviewed the patients History and Physical, chart, labs and discussed the procedure including the risks, benefits and alternatives for the proposed anesthesia with the patient or authorized representative who has indicated his/her understanding and acceptance.     Dental advisory given  Plan Discussed with: CRNA  Anesthesia Plan Comments:         Anesthesia Quick Evaluation

## 2020-04-25 NOTE — Anesthesia Procedure Notes (Signed)
Procedure Name: LMA Insertion Performed by: Dolorez Jeffrey M, CRNA Pre-anesthesia Checklist: Patient identified, Emergency Drugs available, Suction available, Patient being monitored and Timeout performed Patient Re-evaluated:Patient Re-evaluated prior to induction Oxygen Delivery Method: Circle system utilized Preoxygenation: Pre-oxygenation with 100% oxygen Induction Type: IV induction LMA: LMA inserted LMA Size: 3.0 Tube type: Oral Number of attempts: 1 Placement Confirmation: positive ETCO2,  CO2 detector and breath sounds checked- equal and bilateral Tube secured with: Tape       

## 2020-04-25 NOTE — Discharge Instructions (Signed)
°Post Anesthesia Home Care Instructions ° °Activity: °Get plenty of rest for the remainder of the day. A responsible individual must stay with you for 24 hours following the procedure.  °For the next 24 hours, DO NOT: °-Drive a car °-Operate machinery °-Drink alcoholic beverages °-Take any medication unless instructed by your physician °-Make any legal decisions or sign important papers. ° °Meals: °Start with liquid foods such as gelatin or soup. Progress to regular foods as tolerated. Avoid greasy, spicy, heavy foods. If nausea and/or vomiting occur, drink only clear liquids until the nausea and/or vomiting subsides. Call your physician if vomiting continues. ° °Special Instructions/Symptoms: °Your throat may feel dry or sore from the anesthesia or the breathing tube placed in your throat during surgery. If this causes discomfort, gargle with warm salt water. The discomfort should disappear within 24 hours. ° °If you had a scopolamine patch placed behind your ear for the management of post- operative nausea and/or vomiting: ° °1. The medication in the patch is effective for 72 hours, after which it should be removed.  Wrap patch in a tissue and discard in the trash. Wash hands thoroughly with soap and water. °2. You may remove the patch earlier than 72 hours if you experience unpleasant side effects which may include dry mouth, dizziness or visual disturbances. °3. Avoid touching the patch. Wash your hands with soap and water after contact with the patch. °   ° ° °Regional Anesthesia Blocks ° °1. Numbness or the inability to move the "blocked" extremity may last from 3-48 hours after placement. The length of time depends on the medication injected and your individual response to the medication. If the numbness is not going away after 48 hours, call your surgeon. ° °2. The extremity that is blocked will need to be protected until the numbness is gone and the  Strength has returned. Because you cannot feel it, you  will need to take extra care to avoid injury. Because it may be weak, you may have difficulty moving it or using it. You may not know what position it is in without looking at it while the block is in effect. ° °3. For blocks in the legs and feet, returning to weight bearing and walking needs to be done carefully. You will need to wait until the numbness is entirely gone and the strength has returned. You should be able to move your leg and foot normally before you try and bear weight or walk. You will need someone to be with you when you first try to ensure you do not fall and possibly risk injury. ° °4. Bruising and tenderness at the needle site are common side effects and will resolve in a few days. ° °5. Persistent numbness or new problems with movement should be communicated to the surgeon or the [facility] ([phone])/ [facility] ([phone]). ° ° °Information for Discharge Teaching: °EXPAREL (bupivacaine liposome injectable suspension)  ° °Your surgeon or anesthesiologist gave you EXPAREL(bupivacaine) to help control your pain after surgery.  °· EXPAREL is a local anesthetic that provides pain relief by numbing the tissue around the surgical site. °· EXPAREL is designed to release pain medication over time and can control pain for up to 72 hours. °· Depending on how you respond to EXPAREL, you may require less pain medication during your recovery. ° °Possible side effects: °· Temporary loss of sensation or ability to move in the area where bupivacaine was injected. °· Nausea, vomiting, constipation °·   Rarely, numbness and tingling in your mouth or lips, lightheadedness, or anxiety may occur. °· Call your doctor right away if you think you may be experiencing any of these sensations, or if you have other questions regarding possible side effects. ° °Follow all other discharge instructions given to you by your surgeon or nurse. Eat a healthy diet and drink plenty of water or  other fluids. ° °If you return to the hospital for any reason within 96 hours following the administration of EXPAREL, it is important for health care providers to know that you have received this anesthetic. A teal colored band has been placed on your arm with the date, time and amount of EXPAREL you have received in order to alert and inform your health care providers. Please leave this armband in place for the full 96 hours following administration, and then you may remove the band.Information for Discharge Teaching: °EXPAREL (bupivacaine liposome injectable suspension)  ° °Your surgeon or anesthesiologist gave you EXPAREL(bupivacaine) to help control your pain after surgery.  °· EXPAREL is a local anesthetic that provides pain relief by numbing the tissue around the surgical site. °· EXPAREL is designed to release pain medication over time and can control pain for up to 72 hours. °· Depending on how you respond to EXPAREL, you may require less pain medication during your recovery. ° °Possible side effects: °· Temporary loss of sensation or ability to move in the area where bupivacaine was injected. °· Nausea, vomiting, constipation °· Rarely, numbness and tingling in your mouth or lips, lightheadedness, or anxiety may occur. °· Call your doctor right away if you think you may be experiencing any of these sensations, or if you have other questions regarding possible side effects. ° °Follow all other discharge instructions given to you by your surgeon or nurse. Eat a healthy diet and drink plenty of water or other fluids. ° °If you return to the hospital for any reason within 96 hours following the administration of EXPAREL, it is important for health care providers to know that you have received this anesthetic. A teal colored band has been placed on your arm with the date, time and amount of EXPAREL you have received in order to alert and inform your health care providers. Please leave this armband in place for  the full 96 hours following administration, and then you may remove the band. °

## 2020-04-25 NOTE — Transfer of Care (Signed)
Immediate Anesthesia Transfer of Care Note  Patient: Alexa Proctor  Procedure(s) Performed: RIGHT SHOULDER ARTHROSCOPY, DEBRIDEMENT, MINI OPEN ROTATOR CUFF TEAR REPAIR AND OPEN BICEPS TENODESIS (Right Shoulder)  Patient Location: PACU  Anesthesia Type:GA combined with regional for post-op pain  Level of Consciousness: awake, alert , oriented, drowsy and patient cooperative  Airway & Oxygen Therapy: Patient Spontanous Breathing and Patient connected to face mask oxygen  Post-op Assessment: Report given to RN and Post -op Vital signs reviewed and stable  Post vital signs: Reviewed and stable  Last Vitals:  Vitals Value Taken Time  BP    Temp    Pulse    Resp    SpO2      Last Pain:  Vitals:   04/25/20 1023  TempSrc: Oral  PainSc: 0-No pain         Complications: No complications documented.

## 2020-04-25 NOTE — Brief Op Note (Signed)
   04/25/2020  2:49 PM  PATIENT:  Maysoon Marten  64 y.o. female  PRE-OPERATIVE DIAGNOSIS:  right shoulder rotator cuff tear  POST-OPERATIVE DIAGNOSIS:  right shoulder rotator cuff tear  PROCEDURE:  Procedure(s): RIGHT SHOULDER ARTHROSCOPY, DEBRIDEMENT, MINI OPEN ROTATOR CUFF TEAR REPAIR AND OPEN BICEPS TENODESIS  SURGEON:  Surgeon(s): August Saucer, Corrie Mckusick, MD  ASSISTANT: Karenann Cai, PA  ANESTHESIA:   general  EBL: 25 ml    Total I/O In: 800 [I.V.:800] Out: 5 [Blood:5]  BLOOD ADMINISTERED: none  DRAINS: none   LOCAL MEDICATIONS USED:  none  SPECIMEN:  No Specimen  COUNTS:  YES  TOURNIQUET:  * No tourniquets in log *  DICTATION: .Other Dictation: Dictation Number [id number]  PLAN OF CARE: Discharge to home after PACU  PATIENT DISPOSITION:  PACU - hemodynamically stable

## 2020-04-25 NOTE — Progress Notes (Signed)
Assisted Dr. Miller with right, ultrasound guided, interscalene  block. Side rails up, monitors on throughout procedure. See vital signs in flow sheet. Tolerated Procedure well. 

## 2020-04-29 ENCOUNTER — Encounter (HOSPITAL_BASED_OUTPATIENT_CLINIC_OR_DEPARTMENT_OTHER): Payer: Self-pay | Admitting: Orthopedic Surgery

## 2020-05-03 ENCOUNTER — Ambulatory Visit (INDEPENDENT_AMBULATORY_CARE_PROVIDER_SITE_OTHER): Payer: 59 | Admitting: Orthopedic Surgery

## 2020-05-03 ENCOUNTER — Other Ambulatory Visit: Payer: Self-pay

## 2020-05-03 DIAGNOSIS — M75121 Complete rotator cuff tear or rupture of right shoulder, not specified as traumatic: Secondary | ICD-10-CM

## 2020-05-07 ENCOUNTER — Telehealth: Payer: Self-pay

## 2020-05-07 NOTE — Telephone Encounter (Signed)
Pt left me a vm wanting to speak with whoever handles getting the CPM machines set up. Says she has a question regarding the machine.

## 2020-05-08 NOTE — Telephone Encounter (Signed)
Tried calling. No answer.

## 2020-05-12 ENCOUNTER — Encounter: Payer: Self-pay | Admitting: Orthopedic Surgery

## 2020-05-12 NOTE — Progress Notes (Signed)
° °  Post-Op Visit Note   Patient: Alexa Proctor           Date of Birth: [birth year]           MRN: [id number] Visit Date: 05/03/2020 PCP: Sheliah Hatch, MD   Assessment & Plan:  Chief Complaint:  Chief Complaint  Patient presents with   Left Shoulder - Routine Post Op   Visit Diagnoses:  1. Complete tear of right rotator cuff, unspecified whether traumatic     Plan: Laurine is now a week out left shoulder arthroscopy with rotator cuff tear repair.  Been doing reasonably well.  She has reasonable passive range of motion and good strength.  Plan at this time is continue with passive range of motion and start therapy in 2 weeks.  Come back in 3 weeks for clinical recheck.  Follow-Up Instructions: No follow-ups on file.   Orders:  Orders Placed This Encounter  Procedures   Ambulatory referral to Physical Therapy   No orders of the defined types were placed in this encounter.   Imaging: No results found.  PMFS History: Patient Active Problem List   Diagnosis Date Noted   Hyperlipidemia 01/12/2020   GERD (gastroesophageal reflux disease) 06/27/2014   Groin mass in female 01/26/2012   Physical exam 04/13/2011   Screening for malignant neoplasm of cervix 04/13/2011   RLS (restless legs syndrome) 04/13/2011   Breast cancer screening by mammogram 04/13/2011   PETECHIAE 06/18/2009   VARICOSE VEINS, LOWER EXTREMITIES 11/01/2008   Anxiety and depression 10/10/2008   Osteoporosis 10/10/2008   Past Medical History:  Diagnosis Date   Depression    Groin mass in female    Osteoporosis     Family History  Problem Relation Age of Onset   Cancer Mother        breast   Hypertension Father    Cancer Father        pancreatic   Cancer Sister        colon, lung   Breast cancer Neg Hx     Past Surgical History:  Procedure Laterality Date   BREAST SURGERY  03/1992   implants   HERNIA REPAIR     right groin mass  03/24/12   SHOULDER ARTHROSCOPY  WITH ROTATOR CUFF REPAIR AND OPEN BICEPS TENODESIS Right 04/25/2020   Procedure: RIGHT SHOULDER ARTHROSCOPY, DEBRIDEMENT, MINI OPEN ROTATOR CUFF TEAR REPAIR AND OPEN BICEPS TENODESIS;  Surgeon: Cammy Copa, MD;  Location: [facility];  Service: Orthopedics;  Laterality: Right;   TUBAL LIGATION  08/1990   Social History   Occupational History   Not on file  Tobacco Use   Smoking status: Former Smoker    Types: Cigarettes   Smokeless tobacco: Never Used  Building services engineer Use: Every day   Start date: 12/26/2018  Substance and Sexual Activity   Alcohol use: Not Currently   Drug use: No   Sexual activity: Not on file

## 2020-05-15 ENCOUNTER — Other Ambulatory Visit: Payer: Self-pay

## 2020-05-15 ENCOUNTER — Encounter: Payer: Self-pay | Admitting: Physical Therapy

## 2020-05-15 ENCOUNTER — Ambulatory Visit: Payer: 59 | Attending: Surgical | Admitting: Physical Therapy

## 2020-05-15 DIAGNOSIS — R252 Cramp and spasm: Secondary | ICD-10-CM | POA: Diagnosis present

## 2020-05-15 DIAGNOSIS — M25511 Pain in right shoulder: Secondary | ICD-10-CM | POA: Diagnosis present

## 2020-05-15 DIAGNOSIS — M25611 Stiffness of right shoulder, not elsewhere classified: Secondary | ICD-10-CM | POA: Insufficient documentation

## 2020-05-15 NOTE — Therapy (Signed)
[facility] [address] [place], [place], [address] Phone: [phone]   Fax:  [phone]  Physical Therapy Evaluation  Patient Details  Name: Alexa Proctor MRN: [id number] Date of Birth: Aug 04, 1955 Referring Provider (PT): Vaughan Browner Date: 05/15/2020   PT End of Session - 05/15/20 1156    Visit Number 1    Date for PT Re-Evaluation 07/15/20    Authorization Type [employer]    PT Start Time 1010    PT Stop Time 1050    PT Time Calculation (min) 40 min    Activity Tolerance Patient tolerated treatment well    Behavior During Therapy [facility] for tasks assessed/performed           Past Medical History:  Diagnosis Date  . Depression   . Groin mass in female   . Osteoporosis     Past Surgical History:  Procedure Laterality Date  . BREAST SURGERY  03/1992   implants  . HERNIA REPAIR    . right groin mass  03/24/12  . SHOULDER ARTHROSCOPY WITH ROTATOR CUFF REPAIR AND OPEN BICEPS TENODESIS Right 04/25/2020   Procedure: RIGHT SHOULDER ARTHROSCOPY, DEBRIDEMENT, MINI OPEN ROTATOR CUFF TEAR REPAIR AND OPEN BICEPS TENODESIS;  Surgeon: Cammy Copa, MD;  Location: [facility];  Service: Orthopedics;  Laterality: Right;  . TUBAL LIGATION  08/1990    There were no vitals filed for this visit.    Subjective Assessment - 05/15/20 1012    Subjective Patient reports that she started having pain in the right shoulder in April.  She had an MRI that confirmed a tear.  She underwent a right biceps tenodesis, and mini open RC repair on 04/25/20.  Has CPM at home, she reports that she has been doing well.    Limitations Lifting;House hold activities    Patient Stated Goals have normal ROM and good strength and function    Currently in Pain? Yes    Pain Score 0-No pain    Pain Location Shoulder    Pain Orientation Right;Posterior;Lateral    Pain Descriptors / Indicators Sore    Pain Type Acute pain;Surgical  pain    Pain Onset 1 to 4 weeks ago    Pain Frequency Intermittent    Aggravating Factors  first reps on CPM, reaching out some will some pain at night pain up to 5/10    Pain Relieving Factors wearing sling pain can be 0/10    Effect of Pain on Daily Activities difficulty dressing, doing hair all ADL's              [facility] PT Assessment - 05/15/20 0001      Assessment   Medical Diagnosis s/p right RC repair and biceps tenodesis    Referring Provider (PT) Dean    Onset Date/Surgical Date 04/25/20    Hand Dominance Right    Prior Therapy no      Precautions   Precautions Shoulder    Precaution Comments PROM/ AAROM until 06/06/20      Balance Screen   Has the patient fallen in the past 6 months No    Has the patient had a decrease in activity level because of a fear of falling?  No    Is the patient reluctant to leave their home because of a fear of falling?  No      Home Environment   Additional Comments does housework, walks dog      Prior Function  Level of Independence Independent    Vocation Retired    Leisure no exercise      Posture/Postural Control   Posture Comments in a sling, some guarded posture      ROM / Strength   AROM / PROM / Strength PROM      PROM   PROM Assessment Site Shoulder    Right/Left Shoulder Right    Right Shoulder Flexion 130 Degrees    Right Shoulder ABduction 100 Degrees    Right Shoulder Internal Rotation 40 Degrees   at 30 degrees abduction   Right Shoulder External Rotation 30 Degrees      Palpation   Palpation comment tension and tightness in the upper traps and into the neck, mild tenderness in the biceps and over the incision sites                      Objective measurements completed on examination: See above findings.                 PT Short Term Goals - 05/15/20 1201      PT SHORT TERM GOAL #1   Title independent with initial HEP    Time 2    Period Weeks    Status New              PT Long Term Goals - 05/15/20 1201      PT LONG TERM GOAL #1   Title minimal to no pain with ADL's    Time 8    Period Weeks    Status New      PT LONG TERM GOAL #2   Title no difficulty with dressing or doing hair    Time 8    Period Weeks    Status New      PT LONG TERM GOAL #3   Title increase AROM of the right shoulder flexion to 150    Time 8    Period Weeks    Status New      PT LONG TERM GOAL #4   Title increase AROM of ER to 80 degrees    Time 8    Period Weeks    Status New                  Plan - 05/15/20 1157    Clinical Impression Statement Patient underwent a right RC repair with biceps tenodesis on 04/25/20 due to full thickness supraspinatus tear.  She is right handed.  She is in a sling, has a CPM at home.  She is doing very well has minimal pain.  Her PROM today was tolerated very well and is better than many fresh RC repairs.  She is guarded but responds well to cues to relax.    Stability/Clinical Decision Making Evolving/Moderate complexity    Clinical Decision Making Low    Rehab Potential Good    PT Frequency 2x / week    PT Duration 8 weeks    PT Treatment/Interventions ADLs/Self Care Home Management;Cryotherapy;Electrical Stimulation;Therapeutic activities;Therapeutic exercise;Manual techniques;Patient/family education;Vasopneumatic Device    PT Next Visit Plan PROM/AAROM until 06/06/20    Consulted and Agree with Plan of Care Patient           Patient will benefit from skilled therapeutic intervention in order to improve the following deficits and impairments:  Decreased range of motion, Impaired UE functional use, Increased muscle spasms, Pain, Improper body mechanics, Postural dysfunction, Decreased strength  Visit Diagnosis: Acute pain  of right shoulder - Plan: PT plan of care cert/re-cert  Stiffness of right shoulder, not elsewhere classified - Plan: PT plan of care cert/re-cert  Cramp and spasm - Plan: PT plan of care  cert/re-cert     Problem List Patient Active Problem List   Diagnosis Date Noted  . Hyperlipidemia 01/12/2020  . GERD (gastroesophageal reflux disease) 06/27/2014  . Groin mass in female 01/26/2012  . Physical exam 04/13/2011  . Screening for malignant neoplasm of cervix 04/13/2011  . RLS (restless legs syndrome) 04/13/2011  . Breast cancer screening by mammogram 04/13/2011  . PETECHIAE 06/18/2009  . VARICOSE VEINS, LOWER EXTREMITIES 11/01/2008  . Anxiety and depression 10/10/2008  . Osteoporosis 10/10/2008    Lydell Moga W.,PT 05/15/2020, 12:06 PM  [facility] [address] [place], [place], [address] Phone: [phone]   Fax:  [phone]  Name: Meta Winborne MRN: [id number] Date of Birth: Sep 24, 1955

## 2020-05-15 NOTE — Patient Instructions (Signed)
Access Code: [email or web address] URL: [email or web address] Date: 05/15/2020 Prepared by: Stacie Glaze  Exercises Seated Shoulder Shrugs - 2 x daily - 7 x weekly - 2 sets - 10 reps - 3 hold Seated Scapular Retraction - 2 x daily - 7 x weekly - 2 sets - 10 reps - 3 hold Seated Shoulder External Rotation PROM on Table - 2 x daily - 7 x weekly - 2 sets - 10 reps - 10 hold Seated Shoulder Flexion Towel Slide at Table Top - 2 x daily - 7 x weekly - 2 sets - 10 reps - 10 hold Standing Single Arm Eccentric Bicep Curl Pronated then Supinated - 2 x daily - 7 x weekly - 2 sets - 10 reps - 1 hold

## 2020-05-17 ENCOUNTER — Ambulatory Visit (INDEPENDENT_AMBULATORY_CARE_PROVIDER_SITE_OTHER): Payer: 59 | Admitting: Orthopedic Surgery

## 2020-05-17 ENCOUNTER — Other Ambulatory Visit: Payer: Self-pay

## 2020-05-17 DIAGNOSIS — M75121 Complete rotator cuff tear or rupture of right shoulder, not specified as traumatic: Secondary | ICD-10-CM

## 2020-05-18 ENCOUNTER — Encounter: Payer: Self-pay | Admitting: Orthopedic Surgery

## 2020-05-18 NOTE — Progress Notes (Signed)
   Post-Op Visit Note   Patient: Alexa Proctor           Date of Birth: [birth year]           MRN: [id number] Visit Date: 05/17/2020 PCP: Sheliah Hatch, MD   Assessment & Plan:  Chief Complaint:  Chief Complaint  Patient presents with  . Post-op Follow-up   Visit Diagnoses:  1. Complete tear of right rotator cuff, unspecified whether traumatic     Plan: Alexa Proctor is a patient who is now about 3 weeks out right shoulder arthroscopy with rotator cuff repair.  She is in the CPM machine and achieved range of motion of 120.  Went for first PT evaluation yesterday at [facility].  On examination she does have good passive range of motion with abduction and forward flexion.  External rotation is still a little lacking at about 20 to 25 degrees.  Cuff strength is good.  I would like for her to continue in physical therapy to work on external rotation range of motion as well as okay to start doing some isometric strengthening for 3 weeks at this time and then progressed to standard strengthening at the 6-week postop mark.  Discontinue sling use today.  No coarseness or grinding with passive range of motion at this time.  Follow-Up Instructions: Return in about 4 weeks (around 06/14/2020).   Orders:  No orders of the defined types were placed in this encounter.  No orders of the defined types were placed in this encounter.   Imaging: No results found.  PMFS History: Patient Active Problem List   Diagnosis Date Noted  . Hyperlipidemia 01/12/2020  . GERD (gastroesophageal reflux disease) 06/27/2014  . Groin mass in female 01/26/2012  . Physical exam 04/13/2011  . Screening for malignant neoplasm of cervix 04/13/2011  . RLS (restless legs syndrome) 04/13/2011  . Breast cancer screening by mammogram 04/13/2011  . PETECHIAE 06/18/2009  . VARICOSE VEINS, LOWER EXTREMITIES 11/01/2008  . Anxiety and depression 10/10/2008  . Osteoporosis 10/10/2008   Past Medical History:  Diagnosis Date   . Depression   . Groin mass in female   . Osteoporosis     Family History  Problem Relation Age of Onset  . Cancer Mother        breast  . Hypertension Father   . Cancer Father        pancreatic  . Cancer Sister        colon, lung  . Breast cancer Neg Hx     Past Surgical History:  Procedure Laterality Date  . BREAST SURGERY  03/1992   implants  . HERNIA REPAIR    . right groin mass  03/24/12  . SHOULDER ARTHROSCOPY WITH ROTATOR CUFF REPAIR AND OPEN BICEPS TENODESIS Right 04/25/2020   Procedure: RIGHT SHOULDER ARTHROSCOPY, DEBRIDEMENT, MINI OPEN ROTATOR CUFF TEAR REPAIR AND OPEN BICEPS TENODESIS;  Surgeon: Cammy Copa, MD;  Location: [facility];  Service: Orthopedics;  Laterality: Right;  . TUBAL LIGATION  08/1990   Social History   Occupational History  . Not on file  Tobacco Use  . Smoking status: Former Smoker    Types: Cigarettes  . Smokeless tobacco: Never Used  Vaping Use  . Vaping Use: Every day  . Start date: 12/26/2018  Substance and Sexual Activity  . Alcohol use: Not Currently  . Drug use: No  . Sexual activity: Not on file

## 2020-05-21 ENCOUNTER — Ambulatory Visit: Payer: 59 | Admitting: Physical Therapy

## 2020-05-21 ENCOUNTER — Other Ambulatory Visit: Payer: Self-pay

## 2020-05-21 DIAGNOSIS — M25511 Pain in right shoulder: Secondary | ICD-10-CM | POA: Diagnosis not present

## 2020-05-21 DIAGNOSIS — M25611 Stiffness of right shoulder, not elsewhere classified: Secondary | ICD-10-CM

## 2020-05-21 NOTE — Therapy (Signed)
[facility] [address] [place], [place], [address] Phone: [phone]   Fax:  [phone]  Physical Therapy Treatment  Patient Details  Name: Alexa Proctor MRN: [id number] Date of Birth: 09-Jun-1956 Referring Provider (PT): Vaughan Browner Date: 05/21/2020   PT End of Session - 05/21/20 1453    Visit Number 2    Date for PT Re-Evaluation 07/15/20    PT Start Time 1400    PT Stop Time 1440    PT Time Calculation (min) 40 min           Past Medical History:  Diagnosis Date  . Depression   . Groin mass in female   . Osteoporosis     Past Surgical History:  Procedure Laterality Date  . BREAST SURGERY  03/1992   implants  . HERNIA REPAIR    . right groin mass  03/24/12  . SHOULDER ARTHROSCOPY WITH ROTATOR CUFF REPAIR AND OPEN BICEPS TENODESIS Right 04/25/2020   Procedure: RIGHT SHOULDER ARTHROSCOPY, DEBRIDEMENT, MINI OPEN ROTATOR CUFF TEAR REPAIR AND OPEN BICEPS TENODESIS;  Surgeon: Cammy Copa, MD;  Location: [facility];  Service: Orthopedics;  Laterality: Right;  . TUBAL LIGATION  08/1990    There were no vitals filed for this visit.   Subjective Assessment - 05/21/20 1402    Subjective MD pleased and stated in note to start isometrics and start AAROM in 3 weeks. no pain at rest    Currently in Pain? Yes    Pain Score 5     Pain Location Shoulder    Pain Orientation Right                             [facility] Adult PT Treatment/Exercise - 05/21/20 0001      Manual Therapy   Manual Therapy Passive ROM    Passive ROM RT shld all directions                  PT Education - 05/21/20 1453    Education Details isometric shld ex    Person(s) Educated Patient    Methods Explanation;Demonstration;Tactile cues;Verbal cues;Handout    Comprehension Verbalized understanding;Returned demonstration            PT Short Term Goals - 05/15/20 1201      PT SHORT TERM  GOAL #1   Title independent with initial HEP    Time 2    Period Weeks    Status New             PT Long Term Goals - 05/15/20 1201      PT LONG TERM GOAL #1   Title minimal to no pain with ADL's    Time 8    Period Weeks    Status New      PT LONG TERM GOAL #2   Title no difficulty with dressing or doing hair    Time 8    Period Weeks    Status New      PT LONG TERM GOAL #3   Title increase AROM of the right shoulder flexion to 150    Time 8    Period Weeks    Status New      PT LONG TERM GOAL #4   Title increase AROM of ER to 80 degrees    Time 8    Period Weeks    Status New  Plan - 05/21/20 1453    Clinical Impression Statement tolerated PROM with some soreness and guarding but good PROM, added isometrics to HEP    PT Treatment/Interventions ADLs/Self Care Home Management;Cryotherapy;Electrical Stimulation;Therapeutic activities;Therapeutic exercise;Manual techniques;Patient/family education;Vasopneumatic Device    PT Next Visit Plan PROM/AAROM until 06/06/20           Patient will benefit from skilled therapeutic intervention in order to improve the following deficits and impairments:  Decreased range of motion, Impaired UE functional use, Increased muscle spasms, Pain, Improper body mechanics, Postural dysfunction, Decreased strength  Visit Diagnosis: Stiffness of right shoulder, not elsewhere classified  Acute pain of right shoulder     Problem List Patient Active Problem List   Diagnosis Date Noted  . Hyperlipidemia 01/12/2020  . GERD (gastroesophageal reflux disease) 06/27/2014  . Groin mass in female 01/26/2012  . Physical exam 04/13/2011  . Screening for malignant neoplasm of cervix 04/13/2011  . RLS (restless legs syndrome) 04/13/2011  . Breast cancer screening by mammogram 04/13/2011  . PETECHIAE 06/18/2009  . VARICOSE VEINS, LOWER EXTREMITIES 11/01/2008  . Anxiety and depression 10/10/2008  . Osteoporosis  10/10/2008    [name],ANGIE PTA 05/21/2020, 2:54 PM  [facility] [address] [place], [place], [address] Phone: [phone]   Fax:  [phone]  Name: Alexa Proctor MRN: [id number] Date of Birth: 1956-03-11

## 2020-05-26 ENCOUNTER — Encounter: Payer: Self-pay | Admitting: Physical Therapy

## 2020-05-28 ENCOUNTER — Ambulatory Visit: Payer: 59 | Attending: Surgical | Admitting: Physical Therapy

## 2020-05-28 ENCOUNTER — Other Ambulatory Visit: Payer: Self-pay

## 2020-05-28 DIAGNOSIS — R252 Cramp and spasm: Secondary | ICD-10-CM | POA: Diagnosis present

## 2020-05-28 DIAGNOSIS — M25511 Pain in right shoulder: Secondary | ICD-10-CM

## 2020-05-28 DIAGNOSIS — M25611 Stiffness of right shoulder, not elsewhere classified: Secondary | ICD-10-CM | POA: Insufficient documentation

## 2020-05-28 NOTE — Therapy (Signed)
[facility] [address] [place], [place], [address] Phone: [phone]   Fax:  [phone]  Physical Therapy Treatment  Patient Details  Name: Alexa Proctor MRN: [id number] Date of Birth: 01-Sep-1955 Referring Provider (PT): Vaughan Browner Date: 05/28/2020   PT End of Session - 05/28/20 1541    Visit Number 3    Date for PT Re-Evaluation 07/15/20    PT Start Time 1430    PT Stop Time 1525    PT Time Calculation (min) 55 min           Past Medical History:  Diagnosis Date  . Depression   . Groin mass in female   . Osteoporosis     Past Surgical History:  Procedure Laterality Date  . BREAST SURGERY  03/1992   implants  . HERNIA REPAIR    . right groin mass  03/24/12  . SHOULDER ARTHROSCOPY WITH ROTATOR CUFF REPAIR AND OPEN BICEPS TENODESIS Right 04/25/2020   Procedure: RIGHT SHOULDER ARTHROSCOPY, DEBRIDEMENT, MINI OPEN ROTATOR CUFF TEAR REPAIR AND OPEN BICEPS TENODESIS;  Surgeon: Cammy Copa, MD;  Location: [facility];  Service: Orthopedics;  Laterality: Right;  . TUBAL LIGATION  08/1990    There were no vitals filed for this visit.   Subjective Assessment - 05/28/20 1536    Subjective pt verb increased pain after last session and pain with ex at home, more than normal and was concerned    Currently in Pain? Yes    Pain Score 5     Pain Location Shoulder    Pain Orientation Right                             [facility] Adult PT Treatment/Exercise - 05/28/20 0001      Modalities   Modalities Cryotherapy;Electrical Stimulation      Cryotherapy   Number Minutes Cryotherapy 15 Minutes    Cryotherapy Location Shoulder   RT   Type of Cryotherapy Ice pack      Electrical Stimulation   Electrical Stimulation Location RT shld    Electrical Stimulation Action IFC    Electrical Stimulation Parameters supine    Electrical Stimulation Goals Pain      Manual Therapy   Manual  Therapy Joint mobilization;Soft tissue mobilization;Passive ROM;Taping    Manual therapy comments tightness in bicep and tender    Joint Mobilization gentle joint capsule stretch and osscillations     Soft tissue mobilization RT bicpe and ant shld    Passive ROM RT shld all directions- gentle as pt tolerates    Kinesiotex Create Space   RT bicep                 PT Education - 05/28/20 1539    Education Details reviewed isometric to assure tech and not pushing to hard and pt did wel. most pain with flexion isometric. also explained to pt in length about RTC surgery and recovery through all stages and sometimes there are set backs as we progress    Person(s) Educated Patient    Methods Explanation;Demonstration    Comprehension Verbalized understanding;Returned demonstration            PT Short Term Goals - 05/28/20 1543      PT SHORT TERM GOAL #1   Title independent with initial HEP    Status Achieved  PT Long Term Goals - 05/15/20 1201      PT LONG TERM GOAL #1   Title minimal to no pain with ADL's    Time 8    Period Weeks    Status New      PT LONG TERM GOAL #2   Title no difficulty with dressing or doing hair    Time 8    Period Weeks    Status New      PT LONG TERM GOAL #3   Title increase AROM of the right shoulder flexion to 150    Time 8    Period Weeks    Status New      PT LONG TERM GOAL #4   Title increase AROM of ER to 80 degrees    Time 8    Period Weeks    Status New                 Plan - 05/28/20 1541    Clinical Impression Statement pt verb much less pain at end of session and felt better afte educ. added more jt mobs and osscialtons with PROM and pt felt good with that, added KT tape to bicep after STW noted tightness and tenderness.spent time with educ to pt of shld recovery after RTC. added estim for pain    PT Next Visit Plan PROM/AAROM until 06/06/20,assess and progress as tolerated. check ROM            Patient will benefit from skilled therapeutic intervention in order to improve the following deficits and impairments:  Decreased range of motion, Impaired UE functional use, Increased muscle spasms, Pain, Improper body mechanics, Postural dysfunction, Decreased strength  Visit Diagnosis: Stiffness of right shoulder, not elsewhere classified  Acute pain of right shoulder  Cramp and spasm     Problem List Patient Active Problem List   Diagnosis Date Noted  . Hyperlipidemia 01/12/2020  . GERD (gastroesophageal reflux disease) 06/27/2014  . Groin mass in female 01/26/2012  . Physical exam 04/13/2011  . Screening for malignant neoplasm of cervix 04/13/2011  . RLS (restless legs syndrome) 04/13/2011  . Breast cancer screening by mammogram 04/13/2011  . PETECHIAE 06/18/2009  . VARICOSE VEINS, LOWER EXTREMITIES 11/01/2008  . Anxiety and depression 10/10/2008  . Osteoporosis 10/10/2008    Jerelyn Trimarco,ANGIE PTA 05/28/2020, 3:45 PM  [facility] [address] [place], [place], [address] Phone: [phone]   Fax:  [phone]  Name: Alexa Proctor MRN: [id number] Date of Birth: 29-Nov-1955

## 2020-05-30 ENCOUNTER — Other Ambulatory Visit: Payer: Self-pay

## 2020-05-30 ENCOUNTER — Ambulatory Visit: Payer: 59 | Admitting: Physical Therapy

## 2020-05-30 DIAGNOSIS — M25611 Stiffness of right shoulder, not elsewhere classified: Secondary | ICD-10-CM

## 2020-05-30 DIAGNOSIS — R252 Cramp and spasm: Secondary | ICD-10-CM

## 2020-05-30 DIAGNOSIS — M25511 Pain in right shoulder: Secondary | ICD-10-CM

## 2020-05-30 NOTE — Therapy (Signed)
[facility] [address] [place], [place], [address] Phone: [phone]   Fax:  [phone]  Physical Therapy Treatment  Patient Details  Name: Alexa Proctor MRN: [id number] Date of Birth: [birth year] Referring Provider (PT): Vaughan Browner Date: 05/30/2020   PT End of Session - 05/30/20 1429    Visit Number 4    Date for PT Re-Evaluation 07/15/20    PT Start Time 1355    PT Stop Time 1445    PT Time Calculation (min) 50 min           Past Medical History:  Diagnosis Date  . Depression   . Groin mass in female   . Osteoporosis     Past Surgical History:  Procedure Laterality Date  . BREAST SURGERY  03/1992   implants  . HERNIA REPAIR    . right groin mass  03/24/12  . SHOULDER ARTHROSCOPY WITH ROTATOR CUFF REPAIR AND OPEN BICEPS TENODESIS Right 04/25/2020   Procedure: RIGHT SHOULDER ARTHROSCOPY, DEBRIDEMENT, MINI OPEN ROTATOR CUFF TEAR REPAIR AND OPEN BICEPS TENODESIS;  Surgeon: Cammy Copa, MD;  Location: [facility];  Service: Orthopedics;  Laterality: Right;  . TUBAL LIGATION  08/1990    There were no vitals filed for this visit.   Subjective Assessment - 05/30/20 1355    Subjective doing much better , last session helped. no pain at rest    Currently in Pain? Yes    Pain Score 5     Pain Location Shoulder    Pain Orientation Right              [facility] PT Assessment - 05/30/20 0001      PROM   PROM Assessment Site Shoulder   SUPINE   Right/Left Shoulder Right    Right Shoulder Flexion 153 Degrees    Right Shoulder ABduction 156 Degrees    Right Shoulder Internal Rotation 58 Degrees   60 deg. abduction   Right Shoulder External Rotation 58 Degrees                         [facility] Adult PT Treatment/Exercise - 05/30/20 0001      Exercises   Exercises Shoulder      Shoulder Exercises: Supine   Other Supine Exercises supine isometrics 10 each PTA resistance to  assure not pressing too hard      Cryotherapy   Type of Cryotherapy Ice pack      Electrical Stimulation   Electrical Stimulation Location RT shld    Electrical Stimulation Action IFC    Electrical Stimulation Parameters supine    Electrical Stimulation Goals Pain      Manual Therapy   Manual Therapy Joint mobilization;Soft tissue mobilization;Passive ROM;Taping    Joint Mobilization gentle joint capsule stretch and osscillations     Soft tissue mobilization RT bicpe and ant shld    Passive ROM RT shld all directions- gentle as pt tolerates                    PT Short Term Goals - 05/28/20 1543      PT SHORT TERM GOAL #1   Title independent with initial HEP    Status Achieved             PT Long Term Goals - 05/30/20 1429      PT LONG TERM GOAL #1   Title minimal to no pain with  ADL's    Status On-going      PT LONG TERM GOAL #2   Title no difficulty with dressing or doing hair    Status On-going      PT LONG TERM GOAL #3   Title increase AROM of the right shoulder flexion to 150    Status On-going      PT LONG TERM GOAL #4   Title increase AROM of ER to 80 degrees    Status On-going                 Plan - 05/30/20 1430    Clinical Impression Statement pt doing feeling much better today, minimal tightness and tenderness in bicep and increased PROM with some cuing to relax, relief with joint mobs and estim. LTGS ongoing as limited by protocol    PT Treatment/Interventions ADLs/Self Care Home Management;Cryotherapy;Electrical Stimulation;Therapeutic activities;Therapeutic exercise;Manual techniques;Patient/family education;Vasopneumatic Device    PT Next Visit Plan PROM/AAROM until 06/06/20           Patient will benefit from skilled therapeutic intervention in order to improve the following deficits and impairments:  Decreased range of motion, Impaired UE functional use, Increased muscle spasms, Pain, Improper body mechanics, Postural  dysfunction, Decreased strength  Visit Diagnosis: Stiffness of right shoulder, not elsewhere classified  Acute pain of right shoulder  Cramp and spasm     Problem List Patient Active Problem List   Diagnosis Date Noted  . Hyperlipidemia 01/12/2020  . GERD (gastroesophageal reflux disease) 06/27/2014  . Groin mass in female 01/26/2012  . Physical exam 04/13/2011  . Screening for malignant neoplasm of cervix 04/13/2011  . RLS (restless legs syndrome) 04/13/2011  . Breast cancer screening by mammogram 04/13/2011  . PETECHIAE 06/18/2009  . VARICOSE VEINS, LOWER EXTREMITIES 11/01/2008  . Anxiety and depression 10/10/2008  . Osteoporosis 10/10/2008    Milianna Ericsson,ANGIE PTA 05/30/2020, 2:32 PM  [facility] [address] [place], [place], [address] Phone: [phone]   Fax:  [phone]  Name: Lourdez Holden MRN: [id number] Date of Birth: [birth year]

## 2020-06-04 ENCOUNTER — Other Ambulatory Visit: Payer: Self-pay

## 2020-06-04 ENCOUNTER — Ambulatory Visit: Payer: 59 | Admitting: Physical Therapy

## 2020-06-04 DIAGNOSIS — M25511 Pain in right shoulder: Secondary | ICD-10-CM

## 2020-06-04 DIAGNOSIS — M25611 Stiffness of right shoulder, not elsewhere classified: Secondary | ICD-10-CM

## 2020-06-04 NOTE — Therapy (Signed)
[facility] [address] [place], [place], [address] Phone: [phone]   Fax:  [phone]  Physical Therapy Treatment  Patient Details  Name: Alexa Proctor MRN: [id number] Date of Birth: Aug 26, 1955 Referring Provider (PT): Vaughan Browner Date: 06/04/2020   PT End of Session - 06/04/20 1525    Visit Number 5    Date for PT Re-Evaluation 07/15/20    PT Start Time 1440    PT Stop Time 1525    PT Time Calculation (min) 45 min           Past Medical History:  Diagnosis Date  . Depression   . Groin mass in female   . Osteoporosis     Past Surgical History:  Procedure Laterality Date  . BREAST SURGERY  03/1992   implants  . HERNIA REPAIR    . right groin mass  03/24/12  . SHOULDER ARTHROSCOPY WITH ROTATOR CUFF REPAIR AND OPEN BICEPS TENODESIS Right 04/25/2020   Procedure: RIGHT SHOULDER ARTHROSCOPY, DEBRIDEMENT, MINI OPEN ROTATOR CUFF TEAR REPAIR AND OPEN BICEPS TENODESIS;  Surgeon: Cammy Copa, MD;  Location: [facility];  Service: Orthopedics;  Laterality: Right;  . TUBAL LIGATION  08/1990    There were no vitals filed for this visit.   Subjective Assessment - 06/04/20 1438    Subjective overall doing much better, more ache down arm    Currently in Pain? Yes    Pain Score 2     Pain Location Shoulder                             [facility] Adult PT Treatment/Exercise - 06/04/20 0001      Shoulder Exercises: Seated   Other Seated Exercises pulleys 15 x each directions      Shoulder Exercises: Standing   Other Standing Exercises rolling ball on mat fwd/back,CC and CCW, SW 15 each   BUE     Shoulder Exercises: ROM/Strengthening   UBE (Upper Arm Bike) L 1 2 min fwd 2 min backward      Cryotherapy   Type of Cryotherapy Ice pack      Manual Therapy   Manual Therapy Joint mobilization;Soft tissue mobilization;Passive ROM    Joint Mobilization gentle joint capsule stretch and  osscillations     Passive ROM RT shld all directions- gentle as pt tolerates                    PT Short Term Goals - 05/28/20 1543      PT SHORT TERM GOAL #1   Title independent with initial HEP    Status Achieved             PT Long Term Goals - 05/30/20 1429      PT LONG TERM GOAL #1   Title minimal to no pain with ADL's    Status On-going      PT LONG TERM GOAL #2   Title no difficulty with dressing or doing hair    Status On-going      PT LONG TERM GOAL #3   Title increase AROM of the right shoulder flexion to 150    Status On-going      PT LONG TERM GOAL #4   Title increase AROM of ER to 80 degrees    Status On-going  Plan - 06/04/20 1525    Clinical Impression Statement added AA ex today -needed some cuing verb and tactile needed as well as with PROM    PT Treatment/Interventions ADLs/Self Care Home Management;Cryotherapy;Electrical Stimulation;Therapeutic activities;Therapeutic exercise;Manual techniques;Patient/family education;Vasopneumatic Device    PT Next Visit Plan PROM/AAROM until 06/06/20           Patient will benefit from skilled therapeutic intervention in order to improve the following deficits and impairments:  Decreased range of motion, Impaired UE functional use, Increased muscle spasms, Pain, Improper body mechanics, Postural dysfunction, Decreased strength  Visit Diagnosis: Acute pain of right shoulder  Stiffness of right shoulder, not elsewhere classified     Problem List Patient Active Problem List   Diagnosis Date Noted  . Hyperlipidemia 01/12/2020  . GERD (gastroesophageal reflux disease) 06/27/2014  . Groin mass in female 01/26/2012  . Physical exam 04/13/2011  . Screening for malignant neoplasm of cervix 04/13/2011  . RLS (restless legs syndrome) 04/13/2011  . Breast cancer screening by mammogram 04/13/2011  . PETECHIAE 06/18/2009  . VARICOSE VEINS, LOWER EXTREMITIES 11/01/2008  . Anxiety  and depression 10/10/2008  . Osteoporosis 10/10/2008    Thamar Holik,ANGIE PTA 06/04/2020, 3:31 PM  [facility] [address] [place], [place], [address] Phone: [phone]   Fax:  [phone]  Name: Alexa Proctor MRN: [id number] Date of Birth: 1956/05/05

## 2020-06-06 ENCOUNTER — Encounter: Payer: Self-pay | Admitting: Physical Therapy

## 2020-06-06 ENCOUNTER — Ambulatory Visit: Payer: 59 | Admitting: Physical Therapy

## 2020-06-06 ENCOUNTER — Other Ambulatory Visit: Payer: Self-pay

## 2020-06-06 DIAGNOSIS — M25611 Stiffness of right shoulder, not elsewhere classified: Secondary | ICD-10-CM | POA: Diagnosis not present

## 2020-06-06 DIAGNOSIS — M25511 Pain in right shoulder: Secondary | ICD-10-CM

## 2020-06-06 DIAGNOSIS — R252 Cramp and spasm: Secondary | ICD-10-CM

## 2020-06-06 NOTE — Therapy (Signed)
[facility] [address] [place], [place], [address] Phone: [phone]   Fax:  [phone]  Physical Therapy Treatment  Patient Details  Name: Alexa Proctor MRN: [id number] Date of Birth: 09/12/1955 Referring Provider (PT): Vaughan Browner Date: 06/06/2020   PT End of Session - 06/06/20 1608    Visit Number 6    Date for PT Re-Evaluation 07/15/20    PT Start Time 0330    PT Stop Time 0418    PT Time Calculation (min) 48 min    Activity Tolerance Patient tolerated treatment well    Behavior During Therapy [facility] for tasks assessed/performed           Past Medical History:  Diagnosis Date  . Depression   . Groin mass in female   . Osteoporosis     Past Surgical History:  Procedure Laterality Date  . BREAST SURGERY  03/1992   implants  . HERNIA REPAIR    . right groin mass  03/24/12  . SHOULDER ARTHROSCOPY WITH ROTATOR CUFF REPAIR AND OPEN BICEPS TENODESIS Right 04/25/2020   Procedure: RIGHT SHOULDER ARTHROSCOPY, DEBRIDEMENT, MINI OPEN ROTATOR CUFF TEAR REPAIR AND OPEN BICEPS TENODESIS;  Surgeon: Cammy Copa, MD;  Location: [facility];  Service: Orthopedics;  Laterality: Right;  . TUBAL LIGATION  08/1990    There were no vitals filed for this visit.   Subjective Assessment - 06/06/20 1534    Subjective Doing well today. No longer have pain in my forearm after putting cream on it.    Currently in Pain? No/denies              [facility] PT Assessment - 06/06/20 0001      ROM / Strength   AROM / PROM / Strength AROM      AROM   AROM Assessment Site Shoulder    Right/Left Shoulder Right    Right Shoulder Flexion 160 Degrees   supine   Right Shoulder ABduction 145 Degrees   supine     PROM   PROM Assessment Site Shoulder   SUPINE   Right/Left Shoulder Right    Right Shoulder Flexion 160 Degrees    Right Shoulder ABduction 157 Degrees    Right Shoulder Internal Rotation 60 Degrees   60  degress abd   Right Shoulder External Rotation 70 Degrees                         [facility] Adult PT Treatment/Exercise - 06/06/20 0001      Shoulder Exercises: Supine   Other Supine Exercises Cane chest press/flexion/ER 2x10      Shoulder Exercises: Standing   Internal Rotation 20 reps;AAROM;Both   Cane   Extension [employer]   Other Standing Exercises rolling ball on mat fwd/back,CC and CCW, SW 2x10 ea   L hand over R     Shoulder Exercises: ROM/Strengthening   UBE (Upper Arm Bike) L 1 2 min fwd 2 min backward      Modalities   Modalities Cryotherapy      Cryotherapy   Number Minutes Cryotherapy 10 Minutes    Type of Cryotherapy Ice pack      Manual Therapy   Manual Therapy Joint mobilization;Soft tissue mobilization;Passive ROM    Joint Mobilization gentle joint capsule stretch and osscillations     Passive ROM RT shld all directions- gentle as pt tolerates  PT Short Term Goals - 05/28/20 1543      PT SHORT TERM GOAL #1   Title independent with initial HEP    Status Achieved             PT Long Term Goals - 05/30/20 1429      PT LONG TERM GOAL #1   Title minimal to no pain with ADL's    Status On-going      PT LONG TERM GOAL #2   Title no difficulty with dressing or doing hair    Status On-going      PT LONG TERM GOAL #3   Title increase AROM of the right shoulder flexion to 150    Status On-going      PT LONG TERM GOAL #4   Title increase AROM of ER to 80 degrees    Status On-going                 Plan - 06/06/20 1609    Clinical Impression Statement Progressed pt. with AAROM cane exercises today. She responded well treatment today. Required cuing during AAROM extension and IR to prevent compensation of the trunk.    PT Treatment/Interventions ADLs/Self Care Home Management;Cryotherapy;Electrical Stimulation;Therapeutic activities;Therapeutic exercise;Manual techniques;Patient/family  education;Vasopneumatic Device    PT Next Visit Plan AAROM/AROM advance per protocol. modalities as needed           Patient will benefit from skilled therapeutic intervention in order to improve the following deficits and impairments:  Decreased range of motion, Impaired UE functional use, Increased muscle spasms, Pain, Improper body mechanics, Postural dysfunction, Decreased strength  Visit Diagnosis: Acute pain of right shoulder  Stiffness of right shoulder, not elsewhere classified  Cramp and spasm     Problem List Patient Active Problem List   Diagnosis Date Noted  . Hyperlipidemia 01/12/2020  . GERD (gastroesophageal reflux disease) 06/27/2014  . Groin mass in female 01/26/2012  . Physical exam 04/13/2011  . Screening for malignant neoplasm of cervix 04/13/2011  . RLS (restless legs syndrome) 04/13/2011  . Breast cancer screening by mammogram 04/13/2011  . PETECHIAE 06/18/2009  . VARICOSE VEINS, LOWER EXTREMITIES 11/01/2008  . Anxiety and depression 10/10/2008  . Osteoporosis 10/10/2008    Earnestine Mealing, SPTA 06/06/2020, 4:11 PM  [facility] [address] [place], [place], [address] Phone: [phone]   Fax:  [phone]  Name: Kaveri Nordlund MRN: [id number] Date of Birth: 30-Apr-1956

## 2020-06-11 ENCOUNTER — Ambulatory Visit: Payer: 59 | Admitting: Physical Therapy

## 2020-06-11 ENCOUNTER — Encounter: Payer: Self-pay | Admitting: Physical Therapy

## 2020-06-11 ENCOUNTER — Other Ambulatory Visit: Payer: Self-pay

## 2020-06-11 DIAGNOSIS — M25511 Pain in right shoulder: Secondary | ICD-10-CM

## 2020-06-11 DIAGNOSIS — M25611 Stiffness of right shoulder, not elsewhere classified: Secondary | ICD-10-CM | POA: Diagnosis not present

## 2020-06-11 NOTE — Therapy (Signed)
[facility] [address] [place], [place], [address] Phone: [phone]   Fax:  [phone]  Physical Therapy Treatment  Patient Details  Name: Alexa Proctor MRN: [id number] Date of Birth: 08/30/1955 Referring Provider (PT): Vaughan Browner Date: 06/11/2020   PT End of Session - 06/11/20 1319    Visit Number 7    Date for PT Re-Evaluation 07/15/20    PT Start Time 1225    PT Stop Time 1318    PT Time Calculation (min) 53 min    Activity Tolerance Patient tolerated treatment well    Behavior During Therapy [facility] for tasks assessed/performed           Past Medical History:  Diagnosis Date  . Depression   . Groin mass in female   . Osteoporosis     Past Surgical History:  Procedure Laterality Date  . BREAST SURGERY  03/1992   implants  . HERNIA REPAIR    . right groin mass  03/24/12  . SHOULDER ARTHROSCOPY WITH ROTATOR CUFF REPAIR AND OPEN BICEPS TENODESIS Right 04/25/2020   Procedure: RIGHT SHOULDER ARTHROSCOPY, DEBRIDEMENT, MINI OPEN ROTATOR CUFF TEAR REPAIR AND OPEN BICEPS TENODESIS;  Surgeon: Cammy Copa, MD;  Location: [facility];  Service: Orthopedics;  Laterality: Right;  . TUBAL LIGATION  08/1990    There were no vitals filed for this visit.   Subjective Assessment - 06/11/20 1244    Subjective shoulder feels good, slighly more sore.    Currently in Pain? No/denies                             [facility] Adult PT Treatment/Exercise - 06/11/20 0001      Shoulder Exercises: Standing   Internal Rotation 20 reps;AAROM;Both    Flexion 20 reps;Right;Left;AAROM   Cane   Extension Both;AAROM;20 reps    Row Both;20 reps    Theraband Level (Shoulder Row) Level 1 (Yellow)    Diagonals AAROM;Right;20 reps   Cane (scaption)   Other Standing Exercises Finger ladder x7     Other Standing Exercises Ext. 2x10 (yellow TB)      Shoulder Exercises: ROM/Strengthening   UBE (Upper Arm  Bike) L 1 3 min fwd 2 min backward      Manual Therapy   Manual Therapy Joint mobilization;Soft tissue mobilization;Passive ROM    Joint Mobilization gentle joint capsule stretch and osscillations     Passive ROM RT shld all directions- gentle as pt tolerates                    PT Short Term Goals - 05/28/20 1543      PT SHORT TERM GOAL #1   Title independent with initial HEP    Status Achieved             PT Long Term Goals - 05/30/20 1429      PT LONG TERM GOAL #1   Title minimal to no pain with ADL's    Status On-going      PT LONG TERM GOAL #2   Title no difficulty with dressing or doing hair    Status On-going      PT LONG TERM GOAL #3   Title increase AROM of the right shoulder flexion to 150    Status On-going      PT LONG TERM GOAL #4   Title increase AROM of ER to 80  degrees    Status On-going                 Plan - 06/11/20 1320    Clinical Impression Statement Patient did well with gradual progression of ther ex activities today. Introduced some theraband exercises to start gradually stregnthening the shoulder. Instructed pt on how to use at home TENS unit. She was able to explain and demonstrate how to properly use TENS device.    PT Treatment/Interventions ADLs/Self Care Home Management;Cryotherapy;Electrical Stimulation;Therapeutic activities;Therapeutic exercise;Manual techniques;Patient/family education;Vasopneumatic Device    PT Next Visit Plan AAROM/AROM advance per protocol. modalities as needed. Ask how TENS unit is at home.           Patient will benefit from skilled therapeutic intervention in order to improve the following deficits and impairments:  Decreased range of motion, Impaired UE functional use, Increased muscle spasms, Pain, Improper body mechanics, Postural dysfunction, Decreased strength  Visit Diagnosis: Acute pain of right shoulder  Stiffness of right shoulder, not elsewhere classified     Problem  List Patient Active Problem List   Diagnosis Date Noted  . Hyperlipidemia 01/12/2020  . GERD (gastroesophageal reflux disease) 06/27/2014  . Groin mass in female 01/26/2012  . Physical exam 04/13/2011  . Screening for malignant neoplasm of cervix 04/13/2011  . RLS (restless legs syndrome) 04/13/2011  . Breast cancer screening by mammogram 04/13/2011  . PETECHIAE 06/18/2009  . VARICOSE VEINS, LOWER EXTREMITIES 11/01/2008  . Anxiety and depression 10/10/2008  . Osteoporosis 10/10/2008    Earnestine Mealing, SPTA 06/11/2020, 1:24 PM  [facility] [address] [place], [place], [address] Phone: [phone]   Fax:  [phone]  Name: Alexa Proctor MRN: [id number] Date of Birth: 16-Feb-1956

## 2020-06-13 ENCOUNTER — Other Ambulatory Visit: Payer: Self-pay

## 2020-06-13 ENCOUNTER — Ambulatory Visit: Payer: 59 | Admitting: Physical Therapy

## 2020-06-13 DIAGNOSIS — M25611 Stiffness of right shoulder, not elsewhere classified: Secondary | ICD-10-CM | POA: Diagnosis not present

## 2020-06-13 DIAGNOSIS — M25511 Pain in right shoulder: Secondary | ICD-10-CM

## 2020-06-13 NOTE — Therapy (Signed)
[place]. [place], [place], [address] Phone: [phone]   Fax:  [phone]  Physical Therapy Treatment  Patient Details  Name: Alexa Proctor MRN: [id number] Date of Birth: 06-01-1956 Referring Provider (PT): Jerry Caras Date: 06/13/2020   PT End of Session - 06/13/20 1611    Visit Number 8    Date for PT Re-Evaluation 07/15/20    PT Start Time 4496    PT Stop Time 1600    PT Time Calculation (min) 45 min           Past Medical History:  Diagnosis Date  . Depression   . Groin mass in female   . Osteoporosis     Past Surgical History:  Procedure Laterality Date  . BREAST SURGERY  03/1992   implants  . HERNIA REPAIR    . right groin mass  03/24/12  . SHOULDER ARTHROSCOPY WITH ROTATOR CUFF REPAIR AND OPEN BICEPS TENODESIS Right 04/25/2020   Procedure: RIGHT SHOULDER ARTHROSCOPY, DEBRIDEMENT, MINI OPEN ROTATOR CUFF TEAR REPAIR AND OPEN BICEPS TENODESIS;  Surgeon: Meredith Pel, MD;  Location: [place];  Service: Orthopedics;  Laterality: Right;  . TUBAL LIGATION  08/1990    There were no vitals filed for this visit.   Subjective Assessment - 06/13/20 1607    Subjective doing pretty well and used TENS 1 x    Currently in Pain? Yes    Pain Location Shoulder    Pain Orientation Right              [facility] PT Assessment - 06/13/20 0001      AROM   AROM Assessment Site Shoulder    Right/Left Shoulder Right   standing   Right Shoulder Flexion 111 Degrees    Right Shoulder ABduction 115 Degrees                         [facility] Adult PT Treatment/Exercise - 06/13/20 0001      Shoulder Exercises: Standing   Flexion Limitations eccentric finger ladder flex and abd 5x with assistance    Other Standing Exercises 1# cane ex- issued as HEP   tricep 10 #2 sets 10,bicep 5# 2 set s10   Other Standing Exercises yellow tband shld ext,row ,ER,IR- issued as HEP   4#  shruggs and backward rolls 10x     Shoulder Exercises: ROM/Strengthening   UBE (Upper Arm Bike) L 2 3 min fwd  56mn backward      Manual Therapy   Manual Therapy Joint mobilization;Soft tissue mobilization;Passive ROM    Joint Mobilization gentle joint capsule stretch and osscillations     Passive ROM RT shld all directions- gentle as pt tolerates                  PT Education - 06/13/20 1611    Education Details cane ex and yellow tband    Person(s) Educated Patient    Methods Explanation;Demonstration;Handout    Comprehension Verbalized understanding;Returned demonstration;Tactile cues required            PT Short Term Goals - 05/28/20 1543      PT SHORT TERM GOAL #1   Title independent with initial HEP    Status Achieved             PT Long Term Goals - 06/13/20 1612      PT LONG TERM GOAL #1   Title minimal  to no pain with ADL's    Status Partially Met      PT LONG TERM GOAL #2   Title no difficulty with dressing or doing hair    Status On-going      PT LONG TERM GOAL #3   Title increase AROM of the right shoulder flexion to 150    Status Partially Met      PT LONG TERM GOAL #4   Title increase AROM of ER to 80 degrees    Status Partially Met                 Plan - 06/13/20 1613    Clinical Impression Statement progressed AA and AROM ex with verb and tactile cuing. issued increased HEP for 1 x a day. progresisng with goals and seeing MD tomorrow    PT Treatment/Interventions ADLs/Self Care Home Management;Cryotherapy;Electrical Stimulation;Therapeutic activities;Therapeutic exercise;Manual techniques;Patient/family education;Vasopneumatic Device    PT Next Visit Plan assess MD appt           Patient will benefit from skilled therapeutic intervention in order to improve the following deficits and impairments:  Decreased range of motion, Impaired UE functional use, Increased muscle spasms, Pain, Improper body mechanics, Postural  dysfunction, Decreased strength  Visit Diagnosis: Acute pain of right shoulder  Stiffness of right shoulder, not elsewhere classified     Problem List Patient Active Problem List   Diagnosis Date Noted  . Hyperlipidemia 01/12/2020  . GERD (gastroesophageal reflux disease) 06/27/2014  . Groin mass in female 01/26/2012  . Physical exam 04/13/2011  . Screening for malignant neoplasm of cervix 04/13/2011  . RLS (restless legs syndrome) 04/13/2011  . Breast cancer screening by mammogram 04/13/2011  . PETECHIAE 06/18/2009  . VARICOSE VEINS, LOWER EXTREMITIES 11/01/2008  . Anxiety and depression 10/10/2008  . Osteoporosis 10/10/2008    PAYSEUR,ANGIE PTA 06/13/2020, 4:17 PM  [place]. [place], [place], [address] Phone: [phone]   Fax:  [phone]  Name: Alexa Proctor MRN: [id number] Date of Birth: 05/21/1956

## 2020-06-14 ENCOUNTER — Ambulatory Visit (INDEPENDENT_AMBULATORY_CARE_PROVIDER_SITE_OTHER): Payer: 59 | Admitting: Orthopedic Surgery

## 2020-06-14 DIAGNOSIS — M75121 Complete rotator cuff tear or rupture of right shoulder, not specified as traumatic: Secondary | ICD-10-CM

## 2020-06-15 ENCOUNTER — Encounter: Payer: Self-pay | Admitting: Orthopedic Surgery

## 2020-06-15 NOTE — Progress Notes (Signed)
Post-Op Visit Note   Patient: Alexa Proctor           Date of Birth: 03-20-1956           MRN: [id number] Visit Date: 06/14/2020 PCP: Sheliah Hatch, MD   Assessment & Plan:  Chief Complaint:  Chief Complaint  Patient presents with  . Right Shoulder - Routine Post Op   Visit Diagnoses:  1. Complete tear of right rotator cuff, unspecified whether traumatic     Plan: Patient is a 64 year old female who presents following right shoulder arthroscopy with rotator cuff repair.  She is about 7 weeks postop.  She is still continuing to use the CPM and she has recently started strengthening and physical therapy.  On exam she has 40 degrees external rotation, 80 degrees abduction, 120 degrees forward flexion.  She is not using the sling anymore.  She is taking occasional ibuprofen and Tylenol before bed and she does note occasional difficulty sleeping but overall she is happy with how she continues to progress.  Very mild anterior crepitus on exam and excellent strength of the rotator cuff with 5/5 motor strength of the supraspinatus and subscapularis as well as 5 -/5 strength of the infraspinatus.  Patient is satisfied with her progression with physical therapy.  Plan to continue with outpatient physical therapy and follow-up in 6 weeks for likely final check.  Follow-Up Instructions: No follow-ups on file.   Orders:  No orders of the defined types were placed in this encounter.  No orders of the defined types were placed in this encounter.   Imaging: No results found.  PMFS History: Patient Active Problem List   Diagnosis Date Noted  . Hyperlipidemia 01/12/2020  . GERD (gastroesophageal reflux disease) 06/27/2014  . Groin mass in female 01/26/2012  . Physical exam 04/13/2011  . Screening for malignant neoplasm of cervix 04/13/2011  . RLS (restless legs syndrome) 04/13/2011  . Breast cancer screening by mammogram 04/13/2011  . PETECHIAE 06/18/2009  . VARICOSE VEINS, LOWER  EXTREMITIES 11/01/2008  . Anxiety and depression 10/10/2008  . Osteoporosis 10/10/2008   Past Medical History:  Diagnosis Date  . Depression   . Groin mass in female   . Osteoporosis     Family History  Problem Relation Age of Onset  . Cancer Mother        breast  . Hypertension Father   . Cancer Father        pancreatic  . Cancer Sister        colon, lung  . Breast cancer Neg Hx     Past Surgical History:  Procedure Laterality Date  . BREAST SURGERY  03/1992   implants  . HERNIA REPAIR    . right groin mass  03/24/12  . SHOULDER ARTHROSCOPY WITH ROTATOR CUFF REPAIR AND OPEN BICEPS TENODESIS Right 04/25/2020   Procedure: RIGHT SHOULDER ARTHROSCOPY, DEBRIDEMENT, MINI OPEN ROTATOR CUFF TEAR REPAIR AND OPEN BICEPS TENODESIS;  Surgeon: Cammy Copa, MD;  Location: [facility];  Service: Orthopedics;  Laterality: Right;  . TUBAL LIGATION  08/1990   Social History   Occupational History  . Not on file  Tobacco Use  . Smoking status: Former Smoker    Types: Cigarettes  . Smokeless tobacco: Never Used  Vaping Use  . Vaping Use: Every day  . Start date: 12/26/2018  Substance and Sexual Activity  . Alcohol use: Not Currently  . Drug use: No  . Sexual activity: Not on file

## 2020-06-18 ENCOUNTER — Other Ambulatory Visit: Payer: Self-pay

## 2020-06-18 ENCOUNTER — Encounter: Payer: Self-pay | Admitting: Physical Therapy

## 2020-06-18 ENCOUNTER — Ambulatory Visit: Payer: 59 | Admitting: Physical Therapy

## 2020-06-18 DIAGNOSIS — R252 Cramp and spasm: Secondary | ICD-10-CM

## 2020-06-18 DIAGNOSIS — M25611 Stiffness of right shoulder, not elsewhere classified: Secondary | ICD-10-CM | POA: Diagnosis not present

## 2020-06-18 NOTE — Therapy (Signed)
[place]. [place], [place], [address] Phone: [phone]   Fax:  [phone]  Physical Therapy Treatment  Patient Details  Name: Alexa Proctor MRN: [id number] Date of Birth: [birth year] Referring Provider (PT): Jerry Caras Date: 06/18/2020   PT End of Session - 06/18/20 1320    Visit Number 9    Date for PT Re-Evaluation 07/15/20    PT Start Time 1310    PT Stop Time 6283    PT Time Calculation (min) 45 min    Activity Tolerance Patient tolerated treatment well    Behavior During Therapy [facility] for tasks assessed/performed           Past Medical History:  Diagnosis Date  . Depression   . Groin mass in female   . Osteoporosis     Past Surgical History:  Procedure Laterality Date  . BREAST SURGERY  03/1992   implants  . HERNIA REPAIR    . right groin mass  03/24/12  . SHOULDER ARTHROSCOPY WITH ROTATOR CUFF REPAIR AND OPEN BICEPS TENODESIS Right 04/25/2020   Procedure: RIGHT SHOULDER ARTHROSCOPY, DEBRIDEMENT, MINI OPEN ROTATOR CUFF TEAR REPAIR AND OPEN BICEPS TENODESIS;  Surgeon: Meredith Pel, MD;  Location: [place];  Service: Orthopedics;  Laterality: Right;  . TUBAL LIGATION  08/1990    There were no vitals filed for this visit.   Subjective Assessment - 06/18/20 1306    Subjective Doing well. Saw Dr. and was advised to continue with PT.    Currently in Pain? No/denies                             [facility] Adult PT Treatment/Exercise - 06/18/20 0001      Shoulder Exercises: Standing   Other Standing Exercises #1 cane flex/scap/ext/IR 2x10, ball on wall CW/CCW    Other Standing Exercises yellow tband shld ext,row ,ER,IR (standing)   towel squeeze for ER/IR     Shoulder Exercises: ROM/Strengthening   UBE (Upper Arm Bike) L1 2 mins ea way    Nustep L3 37mns      Manual Therapy   Manual Therapy Joint mobilization;Soft tissue mobilization;Passive ROM     Joint Mobilization gentle joint capsule stretch and osscillations     Passive ROM RT shld all directions- gentle as pt tolerates                    PT Short Term Goals - 05/28/20 1543      PT SHORT TERM GOAL #1   Title independent with initial HEP    Status Achieved             PT Long Term Goals - 06/13/20 1612      PT LONG TERM GOAL #1   Title minimal to no pain with ADL's    Status Partially Met      PT LONG TERM GOAL #2   Title no difficulty with dressing or doing hair    Status On-going      PT LONG TERM GOAL #3   Title increase AROM of the right shoulder flexion to 150    Status Partially Met      PT LONG TERM GOAL #4   Title increase AROM of ER to 80 degrees    Status Partially Met                 Plan -  06/18/20 1320    Clinical Impression Statement Continued with light strengthening exercises in available AROM. Patient neeed some cuing during band resisted exercises to isolate shoulder motion and prevent turning her body to assist with the motion. She is progressing really well and would benefit from continued skilled care to address strength deficits and continue with gaining ROM.    PT Treatment/Interventions ADLs/Self Care Home Management;Cryotherapy;Electrical Stimulation;Therapeutic activities;Therapeutic exercise;Manual techniques;Patient/family education;Vasopneumatic Device    PT Next Visit Plan Continue to progress per protocol. AAROM/AROM strengthening           Patient will benefit from skilled therapeutic intervention in order to improve the following deficits and impairments:  Decreased range of motion, Impaired UE functional use, Increased muscle spasms, Pain, Improper body mechanics, Postural dysfunction, Decreased strength  Visit Diagnosis: Stiffness of right shoulder, not elsewhere classified  Cramp and spasm     Problem List Patient Active Problem List   Diagnosis Date Noted  . Hyperlipidemia 01/12/2020  . GERD  (gastroesophageal reflux disease) 06/27/2014  . Groin mass in female 01/26/2012  . Physical exam 04/13/2011  . Screening for malignant neoplasm of cervix 04/13/2011  . RLS (restless legs syndrome) 04/13/2011  . Breast cancer screening by mammogram 04/13/2011  . PETECHIAE 06/18/2009  . VARICOSE VEINS, LOWER EXTREMITIES 11/01/2008  . Anxiety and depression 10/10/2008  . Osteoporosis 10/10/2008    Lavenia Atlas, SPTA 06/18/2020, 2:09 PM  [place]. [place], [place], [address] Phone: [phone]   Fax:  [phone]  Name: Ardie Natt MRN: [id number] Date of Birth: 01-17-1956

## 2020-06-25 ENCOUNTER — Ambulatory Visit: Payer: 59 | Admitting: Physical Therapy

## 2020-06-25 ENCOUNTER — Other Ambulatory Visit: Payer: Self-pay

## 2020-06-25 DIAGNOSIS — M25611 Stiffness of right shoulder, not elsewhere classified: Secondary | ICD-10-CM | POA: Diagnosis not present

## 2020-06-25 DIAGNOSIS — R252 Cramp and spasm: Secondary | ICD-10-CM

## 2020-06-25 NOTE — Therapy (Signed)
[place]. [place], [place], [address] Phone: [phone]   Fax:  [phone]  Physical Therapy Treatment  Patient Details  Name: Alexa Proctor MRN: [id number] Date of Birth: [birth year] Referring Provider (PT): Jerry Caras Date: 06/25/2020   PT End of Session - 06/25/20 1441    Visit Number 10    Date for PT Re-Evaluation 07/15/20    PT Start Time 2951    PT Stop Time 1430    PT Time Calculation (min) 43 min           Past Medical History:  Diagnosis Date  . Depression   . Groin mass in female   . Osteoporosis     Past Surgical History:  Procedure Laterality Date  . BREAST SURGERY  03/1992   implants  . HERNIA REPAIR    . right groin mass  03/24/12  . SHOULDER ARTHROSCOPY WITH ROTATOR CUFF REPAIR AND OPEN BICEPS TENODESIS Right 04/25/2020   Procedure: RIGHT SHOULDER ARTHROSCOPY, DEBRIDEMENT, MINI OPEN ROTATOR CUFF TEAR REPAIR AND OPEN BICEPS TENODESIS;  Surgeon: Meredith Pel, MD;  Location: [facility];  Service: Orthopedics;  Laterality: Right;  . TUBAL LIGATION  08/1990    There were no vitals filed for this visit.   Subjective Assessment - 06/25/20 1348    Subjective overall better and using it more, sending back CPM this week    Currently in Pain? Yes    Pain Score 1     Pain Location Shoulder    Pain Orientation Right                             [facility] Adult PT Treatment/Exercise - 06/25/20 0001      Shoulder Exercises: Supine   Flexion Strengthening;Right;10 reps;Weights    Other Supine Exercises 2# rhy stab      Shoulder Exercises: Sidelying   External Rotation Strengthening;10 reps;Weights    External Rotation Weight (lbs) 2    ABduction Strengthening;Right;10 reps;Weights    ABduction Weight (lbs) 2      Shoulder Exercises: Standing   Other Standing Exercises red tband [place] 5 10 each   wall slide flex and abd 10 x - issued as HEP    Other Standing Exercises 1# flex, abd and PNF 2 sets 5      Shoulder Exercises: ROM/Strengthening   UBE (Upper Arm Bike) L 3 2 min fwd/ 2 min back    Nustep standing RT UE only push /pull and SW 2 min each L2      Manual Therapy   Manual Therapy Joint mobilization;Soft tissue mobilization;Passive ROM    Joint Mobilization gentle joint capsule stretch and osscillations     Passive ROM RT shld all directions- gentle as pt tolerates                    PT Short Term Goals - 05/28/20 1543      PT SHORT TERM GOAL #1   Title independent with initial HEP    Status Achieved             PT Long Term Goals - 06/13/20 1612      PT LONG TERM GOAL #1   Title minimal to no pain with ADL's    Status Partially Met      PT LONG TERM GOAL #2   Title no difficulty with dressing or doing hair  Status On-going      PT LONG TERM GOAL #3   Title increase AROM of the right shoulder flexion to 150    Status Partially Met      PT LONG TERM GOAL #4   Title increase AROM of ER to 80 degrees    Status Partially Met                 Plan - 06/25/20 1441    Clinical Impression Statement progressed ther ex today with cuing verb and tactile to decrease compensations.added wall slide flex and bad to HEP    PT Treatment/Interventions ADLs/Self Care Home Management;Cryotherapy;Electrical Stimulation;Therapeutic activities;Therapeutic exercise;Manual techniques;Patient/family education;Vasopneumatic Device    PT Next Visit Plan Continue to progress per protocol. AAROM/AROM strengthening           Patient will benefit from skilled therapeutic intervention in order to improve the following deficits and impairments:  Decreased range of motion, Impaired UE functional use, Increased muscle spasms, Pain, Improper body mechanics, Postural dysfunction, Decreased strength  Visit Diagnosis: Stiffness of right shoulder, not elsewhere classified  Cramp and spasm     Problem  List Patient Active Problem List   Diagnosis Date Noted  . Hyperlipidemia 01/12/2020  . GERD (gastroesophageal reflux disease) 06/27/2014  . Groin mass in female 01/26/2012  . Physical exam 04/13/2011  . Screening for malignant neoplasm of cervix 04/13/2011  . RLS (restless legs syndrome) 04/13/2011  . Breast cancer screening by mammogram 04/13/2011  . PETECHIAE 06/18/2009  . VARICOSE VEINS, LOWER EXTREMITIES 11/01/2008  . Anxiety and depression 10/10/2008  . Osteoporosis 10/10/2008    Linkon Siverson,ANGIE PTA 06/25/2020, 2:43 PM  [facility] [place]. [place], [place], [address] Phone: [phone]   Fax:  [phone]  Name: Alexa Proctor MRN: [id number] Date of Birth: Jan 11, 1956

## 2020-06-27 ENCOUNTER — Ambulatory Visit: Payer: 59 | Attending: Surgical | Admitting: Physical Therapy

## 2020-06-27 ENCOUNTER — Other Ambulatory Visit: Payer: Self-pay

## 2020-06-27 DIAGNOSIS — R252 Cramp and spasm: Secondary | ICD-10-CM | POA: Diagnosis present

## 2020-06-27 DIAGNOSIS — M25611 Stiffness of right shoulder, not elsewhere classified: Secondary | ICD-10-CM

## 2020-06-27 DIAGNOSIS — M25511 Pain in right shoulder: Secondary | ICD-10-CM | POA: Insufficient documentation

## 2020-06-27 NOTE — Therapy (Signed)
[facility] [place] [place]. [place], [place], [address] Phone: [phone]   Fax:  [phone]  Physical Therapy Treatment  Patient Details  Name: Alexa Proctor MRN: [id number] Date of Birth: 06/15/1956 Referring Provider (PT): Jerry Caras Date: 06/27/2020   PT End of Session - 06/27/20 1941    Visit Number 11    Date for PT Re-Evaluation 07/15/20    PT Start Time 7408    PT Stop Time 1448    PT Time Calculation (min) 48 min           Past Medical History:  Diagnosis Date   Depression    Groin mass in female    Osteoporosis     Past Surgical History:  Procedure Laterality Date   BREAST SURGERY  03/1992   implants   HERNIA REPAIR     right groin mass  03/24/12   SHOULDER ARTHROSCOPY WITH ROTATOR CUFF REPAIR AND OPEN BICEPS TENODESIS Right 04/25/2020   Procedure: RIGHT SHOULDER ARTHROSCOPY, DEBRIDEMENT, MINI OPEN ROTATOR CUFF TEAR REPAIR AND OPEN BICEPS TENODESIS;  Surgeon: Meredith Pel, MD;  Location: [facility];  Service: Orthopedics;  Laterality: Right;   TUBAL LIGATION  08/1990    There were no vitals filed for this visit.   Subjective Assessment - 06/27/20 1518    Subjective very sore after last session but progressivly getting better    Currently in Pain? Yes    Pain Score 1     Pain Location Shoulder    Pain Orientation Right              [facility] PT Assessment - 06/27/20 0001      ROM / Strength   AROM / PROM / Strength Strength      AROM   AROM Assessment Site Shoulder    Right/Left Shoulder Right   Standing   Right Shoulder Flexion 121 Degrees    Right Shoulder ABduction 120 Degrees    Right Shoulder Internal Rotation 60 Degrees    Right Shoulder External Rotation 68 Degrees      PROM   PROM Assessment Site Shoulder    Right/Left Shoulder Right   supine   Right Shoulder Flexion 160 Degrees    Right Shoulder ABduction 175 Degrees      Strength   Overall  Strength Comments flex an dabd 3/5, IR/ER 3+ in available ROM                         [facility] Adult PT Treatment/Exercise - 06/27/20 0001      Shoulder Exercises: Supine   Flexion Strengthening;Right;10 reps;Weights    Shoulder Flexion Weight (lbs) 2      Shoulder Exercises: Seated   Other Seated Exercises shld ext,row and horz abd 2# 2 sets 10      Shoulder Exercises: Standing   Other Standing Exercises red tband [place] 5 10 each   flex and abd func cabinet reaching   Other Standing Exercises 1# flex, abd and PNF 2 sets 5      Shoulder Exercises: ROM/Strengthening   UBE (Upper Arm Bike) L 3 2 min fwd/ 2 min back    Nustep standing RT UE only push /pull and SW 2 min each L2      Manual Therapy   Manual Therapy Joint mobilization;Soft tissue mobilization;Passive ROM    Joint Mobilization gentle joint capsule stretch and osscillations     Passive ROM  RT shld all directions- gentle as pt tolerates                    PT Short Term Goals - 05/28/20 1543      PT SHORT TERM GOAL #1   Title independent with initial HEP    Status Achieved             PT Long Term Goals - 06/27/20 1608      PT LONG TERM GOAL #1   Title minimal to no pain with ADL's    Status Partially Met      PT LONG TERM GOAL #2   Title no difficulty with dressing or doing hair    Status Partially Met      PT LONG TERM GOAL #3   Title increase AROM of the right shoulder flexion to 150    Status Partially Met      PT LONG TERM GOAL #4   Title increase AROM of ER to 80 degrees    Status Partially Met                 Plan - 06/27/20 1609    Clinical Impression Statement progressing with LTGs. increased AROM and strength but functional motion limited d/t weakness. verb and tactile cuing needed    PT Treatment/Interventions ADLs/Self Care Home Management;Cryotherapy;Electrical Stimulation;Therapeutic activities;Therapeutic exercise;Manual techniques;Patient/family  education;Vasopneumatic Device    PT Next Visit Plan Continue to progress per protocol. AAROM/AROM strengthening           Patient will benefit from skilled therapeutic intervention in order to improve the following deficits and impairments:  Decreased range of motion, Impaired UE functional use, Increased muscle spasms, Pain, Improper body mechanics, Postural dysfunction, Decreased strength  Visit Diagnosis: Stiffness of right shoulder, not elsewhere classified  Cramp and spasm  Acute pain of right shoulder     Problem List Patient Active Problem List   Diagnosis Date Noted   Hyperlipidemia 01/12/2020   GERD (gastroesophageal reflux disease) 06/27/2014   Groin mass in female 01/26/2012   Physical exam 04/13/2011   Screening for malignant neoplasm of cervix 04/13/2011   RLS (restless legs syndrome) 04/13/2011   Breast cancer screening by mammogram 04/13/2011   PETECHIAE 06/18/2009   VARICOSE VEINS, LOWER EXTREMITIES 11/01/2008   Anxiety and depression 10/10/2008   Osteoporosis 10/10/2008    Treyten Monestime,ANGIE PTA 06/27/2020, 4:11 PM  [facility]. [place], [place], [address] Phone: [phone]   Fax:  [phone]  Name: Alexa Proctor MRN: [id number] Date of Birth: [birth year]

## 2020-07-02 ENCOUNTER — Other Ambulatory Visit: Payer: Self-pay

## 2020-07-02 ENCOUNTER — Ambulatory Visit: Payer: 59 | Admitting: Physical Therapy

## 2020-07-02 DIAGNOSIS — R252 Cramp and spasm: Secondary | ICD-10-CM

## 2020-07-02 DIAGNOSIS — M25611 Stiffness of right shoulder, not elsewhere classified: Secondary | ICD-10-CM

## 2020-07-02 NOTE — Therapy (Signed)
[facility]. [place], [place], [address] Phone: [phone]   Fax:  [phone]  Physical Therapy Treatment  Patient Details  Name: Alexa Proctor MRN: [id number] Date of Birth: 1956-02-24 Referring Provider (PT): Jerry Caras Date: 07/02/2020   PT End of Session - 07/02/20 4580    Visit Number 12    Date for PT Re-Evaluation 07/15/20    PT Start Time 1525    PT Stop Time 1605    PT Time Calculation (min) 40 min           Past Medical History:  Diagnosis Date  . Depression   . Groin mass in female   . Osteoporosis     Past Surgical History:  Procedure Laterality Date  . BREAST SURGERY  03/1992   implants  . HERNIA REPAIR    . right groin mass  03/24/12  . SHOULDER ARTHROSCOPY WITH ROTATOR CUFF REPAIR AND OPEN BICEPS TENODESIS Right 04/25/2020   Procedure: RIGHT SHOULDER ARTHROSCOPY, DEBRIDEMENT, MINI OPEN ROTATOR CUFF TEAR REPAIR AND OPEN BICEPS TENODESIS;  Surgeon: Meredith Pel, MD;  Location: [facility];  Service: Orthopedics;  Laterality: Right;  . TUBAL LIGATION  08/1990    There were no vitals filed for this visit.   Subjective Assessment - 07/02/20 1525    Subjective dong okay ,using TENS alittle more    Currently in Pain? Yes    Pain Score 1     Pain Location Shoulder    Pain Orientation Right                             [facility] Adult PT Treatment/Exercise - 07/02/20 0001      Shoulder Exercises: Standing   Other Standing Exercises red tband 3 pt rhy stab on wall 10 each   5# pulley IR/ER 2 sets 10   Other Standing Exercises 1# and 2# fwd and lat cabinet reaching      Shoulder Exercises: ROM/Strengthening   UBE (Upper Arm Bike) L 3 3 min fwd/ 3 min back    Nustep standing RT UE only push /pull and SW 2 min each L2    Lat Pull 20 reps   15#   Cybex Row 20 reps   15#     Manual Therapy   Manual Therapy Joint mobilization;Soft tissue  mobilization;Passive ROM    Joint Mobilization gentle joint capsule stretch and osscillations     Passive ROM RT shld all directions- gentle as pt tolerates                    PT Short Term Goals - 05/28/20 1543      PT SHORT TERM GOAL #1   Title independent with initial HEP    Status Achieved             PT Long Term Goals - 06/27/20 1608      PT LONG TERM GOAL #1   Title minimal to no pain with ADL's    Status Partially Met      PT LONG TERM GOAL #2   Title no difficulty with dressing or doing hair    Status Partially Met      PT LONG TERM GOAL #3   Title increase AROM of the right shoulder flexion to 150    Status Partially Met      PT LONG TERM GOAL #4  Title increase AROM of ER to 80 degrees    Status Partially Met                 Plan - 07/02/20 1613    Clinical Impression Statement continuing to progress RT UE func ROM and strength- cuing and assistacne needed to increase ROM and decrease compensations    PT Treatment/Interventions ADLs/Self Care Home Management;Cryotherapy;Electrical Stimulation;Therapeutic activities;Therapeutic exercise;Manual techniques;Patient/family education;Vasopneumatic Device    PT Next Visit Plan 10 weeks thsi week-progress           Patient will benefit from skilled therapeutic intervention in order to improve the following deficits and impairments:  Decreased range of motion, Impaired UE functional use, Increased muscle spasms, Pain, Improper body mechanics, Postural dysfunction, Decreased strength  Visit Diagnosis: Stiffness of right shoulder, not elsewhere classified  Cramp and spasm     Problem List Patient Active Problem List   Diagnosis Date Noted  . Hyperlipidemia 01/12/2020  . GERD (gastroesophageal reflux disease) 06/27/2014  . Groin mass in female 01/26/2012  . Physical exam 04/13/2011  . Screening for malignant neoplasm of cervix 04/13/2011  . RLS (restless legs syndrome) 04/13/2011  .  Breast cancer screening by mammogram 04/13/2011  . PETECHIAE 06/18/2009  . VARICOSE VEINS, LOWER EXTREMITIES 11/01/2008  . Anxiety and depression 10/10/2008  . Osteoporosis 10/10/2008    Shay Bartoli,ANGIE PTA 07/02/2020, 4:15 PM  [place]. [place], [place], [address] Phone: [phone]   Fax:  [phone]  Name: Alexa Proctor MRN: [id number] Date of Birth: 1956/04/09

## 2020-07-04 ENCOUNTER — Encounter: Payer: 59 | Admitting: Physical Therapy

## 2020-07-04 ENCOUNTER — Ambulatory Visit: Payer: 59 | Admitting: Physical Therapy

## 2020-07-04 ENCOUNTER — Other Ambulatory Visit: Payer: Self-pay

## 2020-07-04 DIAGNOSIS — M25611 Stiffness of right shoulder, not elsewhere classified: Secondary | ICD-10-CM | POA: Diagnosis not present

## 2020-07-04 NOTE — Therapy (Signed)
[facility] [address] [place], [place], [address] Phone: [phone]   Fax:  [phone]  Physical Therapy Treatment  Patient Details  Name: Alexa Proctor MRN: [id number] Date of Birth: 08/24/1955 Referring Provider (PT): Dean   Encounter Date: 07/04/2020   PT End of Session - 07/04/20 1434    Visit Number 13    Date for PT Re-Evaluation 07/15/20    PT Start Time 1350    PT Stop Time 1430    PT Time Calculation (min) 40 min           Past Medical History:  Diagnosis Date  . Depression   . Groin mass in female   . Osteoporosis     Past Surgical History:  Procedure Laterality Date  . BREAST SURGERY  03/1992   implants  . HERNIA REPAIR    . right groin mass  03/24/12  . SHOULDER ARTHROSCOPY WITH ROTATOR CUFF REPAIR AND OPEN BICEPS TENODESIS Right 04/25/2020   Procedure: RIGHT SHOULDER ARTHROSCOPY, DEBRIDEMENT, MINI OPEN ROTATOR CUFF TEAR REPAIR AND OPEN BICEPS TENODESIS;  Surgeon: Dean, Gregory Scott, MD;  Location: [facility];  Service: Orthopedics;  Laterality: Right;  . TUBAL LIGATION  08/1990    There were no vitals filed for this visit.   Subjective Assessment - 07/04/20 1359    Subjective surprised I was not more sore    Currently in Pain? Yes    Pain Score 1     Pain Location Shoulder    Pain Orientation Right                             [facility] Adult PT Treatment/Exercise - 07/04/20 0001      Shoulder Exercises: Standing   Other Standing Exercises red tband 3 pt rhy stab on wall 10 each    Other Standing Exercises 1 and 2 # flex, PNF and abd 3 sets 5 each   2# ER 3 sets 5     Shoulder Exercises: ROM/Strengthening   UBE (Upper Arm Bike) L 3 3 min fwd/ 3 min back    Nustep standing RT UE only push /pull and SW 2 min each L2    Lat Pull 20 reps   15#   Cybex Press 20 reps   5#   Cybex Row 20 reps   15#   Wall Pushups 10 reps   ball vs wall     Manual Therapy    Manual Therapy Joint mobilization;Passive ROM    Passive ROM RT shld all directions- gentle as pt tolerates                    PT Short Term Goals - 05/28/20 1543      PT SHORT TERM GOAL #1   Title independent with initial HEP    Status Achieved             PT Long Term Goals - 07/04/20 1437      PT LONG TERM GOAL #1   Title minimal to no pain with ADL's    Status Partially Met      PT LONG TERM GOAL #2   Title no difficulty with dressing or doing hair    Status Partially Met      PT LONG TERM GOAL #3   Title increase AROM of the right shoulder flexion to 150    Status Partially Met        PT LONG TERM GOAL #4   Title increase AROM of ER to 80 degrees    Status Partially Met                 Plan - 07/04/20 1435    Clinical Impression Statement continuing to progress RT UE ROM and strength- PTA helps to achieve end range motion lmited with ex for weakness. progressing with goals    PT Treatment/Interventions ADLs/Self Care Home Management;Cryotherapy;Electrical Stimulation;Therapeutic activities;Therapeutic exercise;Manual techniques;Patient/family education;Vasopneumatic Device    PT Next Visit Plan 10 weeks thsi week-progress           Patient will benefit from skilled therapeutic intervention in order to improve the following deficits and impairments:  Decreased range of motion,Impaired UE functional use,Increased muscle spasms,Pain,Improper body mechanics,Postural dysfunction,Decreased strength  Visit Diagnosis: Stiffness of right shoulder, not elsewhere classified     Problem List Patient Active Problem List   Diagnosis Date Noted  . Hyperlipidemia 01/12/2020  . GERD (gastroesophageal reflux disease) 06/27/2014  . Groin mass in female 01/26/2012  . Physical exam 04/13/2011  . Screening for malignant neoplasm of cervix 04/13/2011  . RLS (restless legs syndrome) 04/13/2011  . Breast cancer screening by mammogram 04/13/2011  . PETECHIAE  06/18/2009  . VARICOSE VEINS, LOWER EXTREMITIES 11/01/2008  . Anxiety and depression 10/10/2008  . Osteoporosis 10/10/2008    PAYSEUR,ANGIE PTA 07/04/2020, 2:38 PM  [facility] [address] [place], [place], [address] Phone: [phone]   Fax:  [phone]  Name: Alexa Proctor MRN: [id number] Date of Birth: 04/10/1956   

## 2020-07-08 ENCOUNTER — Other Ambulatory Visit: Payer: Self-pay | Admitting: Family Medicine

## 2020-07-09 ENCOUNTER — Other Ambulatory Visit: Payer: Self-pay

## 2020-07-09 ENCOUNTER — Ambulatory Visit: Payer: 59 | Admitting: Physical Therapy

## 2020-07-09 DIAGNOSIS — M25611 Stiffness of right shoulder, not elsewhere classified: Secondary | ICD-10-CM

## 2020-07-09 DIAGNOSIS — R252 Cramp and spasm: Secondary | ICD-10-CM

## 2020-07-09 NOTE — Therapy (Signed)
[place]. [place], [place], [address] Phone: [phone]   Fax:  [phone]  Physical Therapy Treatment  Patient Details  Name: Alexa Proctor MRN: [id number] Date of Birth: 08-12-1955 Referring Provider (PT): Jerry Caras Date: 07/09/2020   PT End of Session - 07/09/20 1440    Visit Number 14    Date for PT Re-Evaluation 07/15/20    PT Start Time 1350    PT Stop Time 1435    PT Time Calculation (min) 45 min           Past Medical History:  Diagnosis Date  . Depression   . Groin mass in female   . Osteoporosis     Past Surgical History:  Procedure Laterality Date  . BREAST SURGERY  03/1992   implants  . HERNIA REPAIR    . right groin mass  03/24/12  . SHOULDER ARTHROSCOPY WITH ROTATOR CUFF REPAIR AND OPEN BICEPS TENODESIS Right 04/25/2020   Procedure: RIGHT SHOULDER ARTHROSCOPY, DEBRIDEMENT, MINI OPEN ROTATOR CUFF TEAR REPAIR AND OPEN BICEPS TENODESIS;  Surgeon: Meredith Pel, MD;  Location: [facility];  Service: Orthopedics;  Laterality: Right;  . TUBAL LIGATION  08/1990    There were no vitals filed for this visit.   Subjective Assessment - 07/09/20 1353    Subjective overall better but having some quick shooting pains for no apparent reason and they hurt    Currently in Pain? Yes    Pain Score 1     Pain Location Shoulder              [facility] PT Assessment - 07/09/20 0001      AROM   AROM Assessment Site Shoulder    Right/Left Shoulder Right    Right Shoulder Flexion 125 Degrees    Right Shoulder ABduction 122 Degrees    Right Shoulder Internal Rotation 63 Degrees    Right Shoulder External Rotation 72 Degrees                         [facility] Adult PT Treatment/Exercise - 07/09/20 0001      Shoulder Exercises: Standing   External Rotation Strengthening;Right;20 reps   5# pulley with assitane for initail start   Internal Rotation  Strengthening;Right;20 reps   5# pulley   Other Standing Exercises flex and  abd yellow tband  10 reps with assitance fo rincreased ROM   wt ball toss flex and abd 10 each   Other Standing Exercises 2 # flex, PNF and abd 3 sets 5 each      Shoulder Exercises: ROM/Strengthening   UBE (Upper Arm Bike) L 4 3 min fwd/ 3 min back    Lat Pull Other (comment);15 reps   2 sets 15#   Cybex Press 20 reps   10# with serrarus   Cybex Row Other (comment)   15# 2 sets 15     Manual Therapy   Manual Therapy Joint mobilization;Passive ROM    Joint Mobilization belt mobs flex and ex with and without resistance    Passive ROM RT shld all directions                    PT Short Term Goals - 05/28/20 1543      PT SHORT TERM GOAL #1   Title independent with initial HEP    Status Achieved  PT Long Term Goals - 07/09/20 1441      PT LONG TERM GOAL #1   Title minimal to no pain with ADL's    Status Partially Met      PT LONG TERM GOAL #2   Title no difficulty with dressing or doing hair    Status Partially Met      PT LONG TERM GOAL #3   Title increase AROM of the right shoulder flexion to 150    Status Partially Met      PT LONG TERM GOAL #4   Title increase AROM of ER to 80 degrees    Status Partially Met                 Plan - 07/09/20 1442    Clinical Impression Statement ROM is progressing , weakness is still an issue and at times requires asssitance to either start ex or at end range to gain increased ROM. pt is making progress towards goals and will continue to benefit from skilles therapy to progress.    PT Treatment/Interventions ADLs/Self Care Home Management;Cryotherapy;Electrical Stimulation;Therapeutic activities;Therapeutic exercise;Manual techniques;Patient/family education;Vasopneumatic Device    PT Next Visit Plan progress 11 weeks. Renewal Done           Patient will benefit from skilled therapeutic intervention in order to improve the  following deficits and impairments:  Decreased range of motion,Impaired UE functional use,Increased muscle spasms,Pain,Improper body mechanics,Postural dysfunction,Decreased strength  Visit Diagnosis: Stiffness of right shoulder, not elsewhere classified  Cramp and spasm     Problem List Patient Active Problem List   Diagnosis Date Noted  . Hyperlipidemia 01/12/2020  . GERD (gastroesophageal reflux disease) 06/27/2014  . Groin mass in female 01/26/2012  . Physical exam 04/13/2011  . Screening for malignant neoplasm of cervix 04/13/2011  . RLS (restless legs syndrome) 04/13/2011  . Breast cancer screening by mammogram 04/13/2011  . PETECHIAE 06/18/2009  . VARICOSE VEINS, LOWER EXTREMITIES 11/01/2008  . Anxiety and depression 10/10/2008  . Osteoporosis 10/10/2008    Yasuko Lapage,ANGIE 07/09/2020, 2:45 PM  [place]. [place], [place], [address] Phone: [phone]   Fax:  [phone]  Name: Kateryn Nasha Diss MRN: [id number] Date of Birth: 1956-06-22

## 2020-07-11 ENCOUNTER — Other Ambulatory Visit: Payer: Self-pay

## 2020-07-11 ENCOUNTER — Ambulatory Visit: Payer: 59 | Admitting: Physical Therapy

## 2020-07-11 DIAGNOSIS — M25611 Stiffness of right shoulder, not elsewhere classified: Secondary | ICD-10-CM

## 2020-07-11 DIAGNOSIS — R252 Cramp and spasm: Secondary | ICD-10-CM

## 2020-07-11 NOTE — Therapy (Signed)
[place]. [place], [place], [address] Phone: [phone]   Fax:  [phone]  Physical Therapy Treatment  Patient Details  Name: Alexa Proctor MRN: [id number] Date of Birth: 10-May-1956 Referring Provider (PT): Jerry Caras Date: 07/11/2020   PT End of Session - 07/11/20 1603    Visit Number 15    Date for PT Re-Evaluation 07/15/20    PT Start Time 1520    PT Stop Time 1600    PT Time Calculation (min) 40 min           Past Medical History:  Diagnosis Date  . Depression   . Groin mass in female   . Osteoporosis     Past Surgical History:  Procedure Laterality Date  . BREAST SURGERY  03/1992   implants  . HERNIA REPAIR    . right groin mass  03/24/12  . SHOULDER ARTHROSCOPY WITH ROTATOR CUFF REPAIR AND OPEN BICEPS TENODESIS Right 04/25/2020   Procedure: RIGHT SHOULDER ARTHROSCOPY, DEBRIDEMENT, MINI OPEN ROTATOR CUFF TEAR REPAIR AND OPEN BICEPS TENODESIS;  Surgeon: Meredith Pel, MD;  Location: [facility];  Service: Orthopedics;  Laterality: Right;  . TUBAL LIGATION  08/1990    There were no vitals filed for this visit.   Subjective Assessment - 07/11/20 1521    Subjective doing pretty well-  best I have felt    Currently in Pain? Yes    Pain Score 1     Pain Location Shoulder              [facility] PT Assessment - 07/11/20 0001      AROM   AROM Assessment Site Shoulder    Right/Left Shoulder Right    Right Shoulder Flexion 136 Degrees    Right Shoulder ABduction 138 Degrees    Right Shoulder Internal Rotation 63 Degrees    Right Shoulder External Rotation 76 Degrees                         [facility] Adult PT Treatment/Exercise - 07/11/20 0001      Shoulder Exercises: Supine   Flexion Strengthening;Right;10 reps;Weights    Shoulder Flexion Weight (lbs) 4      Shoulder Exercises: Sidelying   External Rotation Strengthening;Right;10 reps    External  Rotation Weight (lbs) 3    ABduction Strengthening;Right;10 reps    ABduction Weight (lbs) 3      Shoulder Exercises: Standing   Other Standing Exercises tricep ext 25# 2 sets 10   bicep 10# 2 sets 10   Other Standing Exercises 2 # flex, PNF and abd 3 sets 5 each      Shoulder Exercises: ROM/Strengthening   UBE (Upper Arm Bike) L 4 3 min fwd/ 3 min back    Lat Pull 20 reps   20#   Cybex Press 20 reps   10# with serratus   Cybex Row 20 reps   20#     Manual Therapy   Manual Therapy Joint mobilization;Passive ROM    Joint Mobilization belt mobs flex and ex with and without resistance    Passive ROM RT shld all directions                    PT Short Term Goals - 05/28/20 1543      PT SHORT TERM GOAL #1   Title independent with initial HEP    Status Achieved  PT Long Term Goals - 07/11/20 1602      PT LONG TERM GOAL #1   Title minimal to no pain with ADL's    Status Partially Met      PT LONG TERM GOAL #2   Title no difficulty with dressing or doing hair    Status Partially Met      PT LONG TERM GOAL #3   Title increase AROM of the right shoulder flexion to 150    Status Partially Met      PT LONG TERM GOAL #4   Title increase AROM of ER to 80 degrees    Status Partially Met                 Plan - 07/11/20 1604    Clinical Impression Statement ROMand strength improving, verb and tactile cuing for compensations. pt is making good steady progress and benefiting fro skilled interventions    PT Treatment/Interventions ADLs/Self Care Home Management;Cryotherapy;Electrical Stimulation;Therapeutic activities;Therapeutic exercise;Manual techniques;Patient/family education;Vasopneumatic Device    PT Next Visit Plan MD note and renewal next visit           Patient will benefit from skilled therapeutic intervention in order to improve the following deficits and impairments:  Decreased range of motion,Impaired UE functional use,Increased muscle  spasms,Pain,Improper body mechanics,Postural dysfunction,Decreased strength  Visit Diagnosis: Stiffness of right shoulder, not elsewhere classified  Cramp and spasm     Problem List Patient Active Problem List   Diagnosis Date Noted  . Hyperlipidemia 01/12/2020  . GERD (gastroesophageal reflux disease) 06/27/2014  . Groin mass in female 01/26/2012  . Physical exam 04/13/2011  . Screening for malignant neoplasm of cervix 04/13/2011  . RLS (restless legs syndrome) 04/13/2011  . Breast cancer screening by mammogram 04/13/2011  . PETECHIAE 06/18/2009  . VARICOSE VEINS, LOWER EXTREMITIES 11/01/2008  . Anxiety and depression 10/10/2008  . Osteoporosis 10/10/2008    Wilkin Lippy,ANGIE PTA 07/11/2020, 4:06 PM  [place]. [place], [place], [address] Phone: [phone]   Fax:  [phone]  Name: Alexa Proctor MRN: [id number] Date of Birth: 01/20/1956

## 2020-07-16 ENCOUNTER — Ambulatory Visit: Payer: 59 | Admitting: Physical Therapy

## 2020-07-16 ENCOUNTER — Other Ambulatory Visit: Payer: Self-pay

## 2020-07-16 DIAGNOSIS — M25511 Pain in right shoulder: Secondary | ICD-10-CM

## 2020-07-16 DIAGNOSIS — M25611 Stiffness of right shoulder, not elsewhere classified: Secondary | ICD-10-CM | POA: Diagnosis not present

## 2020-07-16 DIAGNOSIS — R252 Cramp and spasm: Secondary | ICD-10-CM

## 2020-07-16 NOTE — Therapy (Signed)
[place]. [place], [place], [address] Phone: [phone]   Fax:  [phone]  Physical Therapy Treatment  Patient Details  Name: Alexa Proctor MRN: [id number] Date of Birth: 08/28/1955 Referring Provider (PT): Jerry Caras Date: 07/16/2020   PT End of Session - 07/16/20 1548    Visit Number 16    Date for PT Re-Evaluation 08/16/20    PT Start Time 0277    PT Stop Time 1550    PT Time Calculation (min) 35 min           Past Medical History:  Diagnosis Date  . Depression   . Groin mass in female   . Osteoporosis     Past Surgical History:  Procedure Laterality Date  . BREAST SURGERY  03/1992   implants  . HERNIA REPAIR    . right groin mass  03/24/12  . SHOULDER ARTHROSCOPY WITH ROTATOR CUFF REPAIR AND OPEN BICEPS TENODESIS Right 04/25/2020   Procedure: RIGHT SHOULDER ARTHROSCOPY, DEBRIDEMENT, MINI OPEN ROTATOR CUFF TEAR REPAIR AND OPEN BICEPS TENODESIS;  Surgeon: Meredith Pel, MD;  Location: [facility];  Service: Orthopedics;  Laterality: Right;  . TUBAL LIGATION  08/1990    There were no vitals filed for this visit.   Subjective Assessment - 07/16/20 1516    Subjective okay but noted increased popping in shld , not consistant    Currently in Pain? Yes    Pain Score 2     Pain Location Shoulder    Pain Orientation Right              [facility] PT Assessment - 07/16/20 0001      AROM   AROM Assessment Site Shoulder    Right/Left Shoulder Right   STANDING   Right Shoulder Flexion 152 Degrees    Right Shoulder ABduction 140 Degrees    Right Shoulder Internal Rotation 67 Degrees    Right Shoulder External Rotation 76 Degrees      Strength   Overall Strength Comments Standing arm at side ER /IR 4-/5, flex/ and 3+5/                         [facility] Adult PT Treatment/Exercise - 07/16/20 0001      Shoulder Exercises: ROM/Strengthening   UBE (Upper Arm  Bike) L 3 3 min fwd/ 3 min back    Nustep standing RT UE only push /pull and SW 2 min each L2    Other ROM/Strengthening Exercises supine,SL and prone RT UE ex various angles and wt      Manual Therapy   Manual Therapy Joint mobilization;Soft tissue mobilization;Passive ROM    Joint Mobilization to increase ROM    Soft tissue mobilization to increase ROM    Passive ROM RT shld all directions                    PT Short Term Goals - 05/28/20 1543      PT SHORT TERM GOAL #1   Title independent with initial HEP    Status Achieved             PT Long Term Goals - 07/16/20 1547      PT LONG TERM GOAL #1   Title minimal to no pain with ADL's    Status Partially Met      PT LONG TERM GOAL #2   Title no difficulty with  dressing or doing hair    Status Partially Met      PT LONG TERM GOAL #3   Title increase AROM of the right shoulder flexion to 150    Status Achieved      PT LONG TERM GOAL #4   Title increase AROM of ER to 80 degrees    Status Partially Met                 Plan - 07/16/20 1548    Clinical Impression Statement pt c/o some increased popping. tolerated increased ther ex in supine,SL and prone with wt ,verb and tactile cuing needed with assistance to increase ROM. progressing with all goals-ROM and strength    PT Treatment/Interventions ADLs/Self Care Home Management;Cryotherapy;Electrical Stimulation;Therapeutic activities;Therapeutic exercise;Manual techniques;Patient/family education;Vasopneumatic Device    PT Next Visit Plan 12 weeks- MD note sent with pt           Patient will benefit from skilled therapeutic intervention in order to improve the following deficits and impairments:  Decreased range of motion,Impaired UE functional use,Increased muscle spasms,Pain,Improper body mechanics,Postural dysfunction,Decreased strength  Visit Diagnosis: Stiffness of right shoulder, not elsewhere classified     Problem List Patient Active  Problem List   Diagnosis Date Noted  . Hyperlipidemia 01/12/2020  . GERD (gastroesophageal reflux disease) 06/27/2014  . Groin mass in female 01/26/2012  . Physical exam 04/13/2011  . Screening for malignant neoplasm of cervix 04/13/2011  . RLS (restless legs syndrome) 04/13/2011  . Breast cancer screening by mammogram 04/13/2011  . PETECHIAE 06/18/2009  . VARICOSE VEINS, LOWER EXTREMITIES 11/01/2008  . Anxiety and depression 10/10/2008  . Osteoporosis 10/10/2008   Amador Cunas, PT, DPT Laqueta Carina PTA 07/16/2020, 3:51 PM  [place]. [place], [place], [address] Phone: [phone]   Fax:  [phone]  Name: Jyoti Lansdowne MRN: [id number] Date of Birth: 11-04-1955

## 2020-07-17 ENCOUNTER — Ambulatory Visit (INDEPENDENT_AMBULATORY_CARE_PROVIDER_SITE_OTHER): Payer: 59 | Admitting: Orthopedic Surgery

## 2020-07-17 DIAGNOSIS — M75121 Complete rotator cuff tear or rupture of right shoulder, not specified as traumatic: Secondary | ICD-10-CM

## 2020-07-18 ENCOUNTER — Encounter: Payer: Self-pay | Admitting: Orthopedic Surgery

## 2020-07-18 ENCOUNTER — Ambulatory Visit: Payer: 59 | Admitting: Physical Therapy

## 2020-07-18 ENCOUNTER — Ambulatory Visit: Payer: 59 | Admitting: Orthopedic Surgery

## 2020-07-18 NOTE — Progress Notes (Signed)
Post-Op Visit Note   Patient: Alexa Proctor           Date of Birth: 07/12/1956           MRN: [id number] Visit Date: 07/17/2020 PCP: Sheliah Hatch, MD   Assessment & Plan:  Chief Complaint:  Chief Complaint  Patient presents with   Right Shoulder - Follow-up    04/25/2020 right shoulder arthroscopy with [facility]   Visit Diagnoses:  1. Complete tear of right rotator cuff, unspecified whether traumatic     Plan: Maansi is now about 3 months out from right shoulder arthroscopy rotator cuff repair.  Still has some aching pain at times.  She has been making progress and forward flexion and abduction in therapy.  Been getting a little stronger over the last few weeks.  On exam she has improving range of motion with about 30 degrees of external rotation at 15 degrees abduction 145 forward flexion and abduction to 90.  No coarse popping or grinding.  I think she does have a little bit of adhesive capsulitis following her rotator cuff surgery.  She is getting better.  Continue with therapy for another 6 weeks to work more on range of motion and strengthening.  I think when she gets her range of motion back the pain will improve.  Follow-up in 3 months for final check.  Follow-Up Instructions: No follow-ups on file.   Orders:  Orders Placed This Encounter  Procedures   Ambulatory referral to Physical Therapy   No orders of the defined types were placed in this encounter.   Imaging: No results found.  PMFS History: Patient Active Problem List   Diagnosis Date Noted   Hyperlipidemia 01/12/2020   GERD (gastroesophageal reflux disease) 06/27/2014   Groin mass in female 01/26/2012   Physical exam 04/13/2011   Screening for malignant neoplasm of cervix 04/13/2011   RLS (restless legs syndrome) 04/13/2011   Breast cancer screening by mammogram 04/13/2011   PETECHIAE 06/18/2009   VARICOSE VEINS, LOWER EXTREMITIES 11/01/2008   Anxiety and depression 10/10/2008    Osteoporosis 10/10/2008   Past Medical History:  Diagnosis Date   Depression    Groin mass in female    Osteoporosis     Family History  Problem Relation Age of Onset   Cancer Mother        breast   Hypertension Father    Cancer Father        pancreatic   Cancer Sister        colon, lung   Breast cancer Neg Hx     Past Surgical History:  Procedure Laterality Date   BREAST SURGERY  03/1992   implants   HERNIA REPAIR     right groin mass  03/24/12   SHOULDER ARTHROSCOPY WITH ROTATOR CUFF REPAIR AND OPEN BICEPS TENODESIS Right 04/25/2020   Procedure: RIGHT SHOULDER ARTHROSCOPY, DEBRIDEMENT, MINI OPEN ROTATOR CUFF TEAR REPAIR AND OPEN BICEPS TENODESIS;  Surgeon: Cammy Copa, MD;  Location: [facility];  Service: Orthopedics;  Laterality: Right;   TUBAL LIGATION  08/1990   Social History   Occupational History   Not on file  Tobacco Use   Smoking status: Former Smoker    Types: Cigarettes   Smokeless tobacco: Never Used  Building services engineer Use: Every day   Start date: 12/26/2018  Substance and Sexual Activity   Alcohol use: Not Currently   Drug use: No   Sexual activity: Not on file

## 2020-07-22 ENCOUNTER — Encounter: Payer: Self-pay | Admitting: Family Medicine

## 2020-07-23 ENCOUNTER — Ambulatory Visit: Payer: 59 | Admitting: Physical Therapy

## 2020-07-23 ENCOUNTER — Other Ambulatory Visit: Payer: Self-pay

## 2020-07-23 DIAGNOSIS — M25611 Stiffness of right shoulder, not elsewhere classified: Secondary | ICD-10-CM

## 2020-07-23 DIAGNOSIS — R252 Cramp and spasm: Secondary | ICD-10-CM

## 2020-07-23 NOTE — Therapy (Signed)
[place]. [place], [place], [address] Phone: [phone]   Fax:  [phone]  Physical Therapy Treatment  Patient Details  Name: Alexa Proctor MRN: [id number] Date of Birth: 06-Apr-1956 Referring Provider (PT): Jerry Caras Date: 07/23/2020   PT End of Session - 07/23/20 1435    Visit Number 17    Date for PT Re-Evaluation 08/16/20    PT Start Time 1350    PT Stop Time 1435    PT Time Calculation (min) 45 min           Past Medical History:  Diagnosis Date  . Depression   . Groin mass in female   . Osteoporosis     Past Surgical History:  Procedure Laterality Date  . BREAST SURGERY  03/1992   implants  . HERNIA REPAIR    . right groin mass  03/24/12  . SHOULDER ARTHROSCOPY WITH ROTATOR CUFF REPAIR AND OPEN BICEPS TENODESIS Right 04/25/2020   Procedure: RIGHT SHOULDER ARTHROSCOPY, DEBRIDEMENT, MINI OPEN ROTATOR CUFF TEAR REPAIR AND OPEN BICEPS TENODESIS;  Surgeon: Meredith Pel, MD;  Location: [facility];  Service: Orthopedics;  Laterality: Right;  . TUBAL LIGATION  08/1990    There were no vitals filed for this visit.   Subjective Assessment - 07/23/20 1348    Subjective saw MD and he was pleased and felt popping was not an issue f/u in 3 monthes. pt verb popping has been better    Currently in Pain? No/denies                             [facility] Adult PT Treatment/Exercise - 07/23/20 0001      Shoulder Exercises: ROM/Strengthening   UBE (Upper Arm Bike) L 4 3 fwd/3back    Lat Pull 20 reps   20#   Cybex Press 20 reps   10# with serratus   Cybex Row 20 reps   20#     Manual Therapy   Manual Therapy Joint mobilization;Soft tissue mobilization;Passive ROM    Joint Mobilization to increase ROM    Soft tissue mobilization to increase ROM    Passive ROM RT shld all directions   aggressive pushing to end range as tolerated                 PT  Education - 07/23/20 1434    Education Details reviewed some strengtheing ex for home with 1-2# wts with reaching [place] to strengthen    Person(s) Educated Patient    Methods Explanation    Comprehension Verbalized understanding            PT Short Term Goals - 05/28/20 1543      PT SHORT TERM GOAL #1   Title independent with initial HEP    Status Achieved             PT Long Term Goals - 07/16/20 1547      PT LONG TERM GOAL #1   Title minimal to no pain with ADL's    Status Partially Met      PT LONG TERM GOAL #2   Title no difficulty with dressing or doing hair    Status Partially Met      PT LONG TERM GOAL #3   Title increase AROM of the right shoulder flexion to 150    Status Achieved      PT LONG TERM  GOAL #4   Title increase AROM of ER to 80 degrees    Status Partially Met                 Plan - 07/23/20 1437    Clinical Impression Statement aggressive PROM as MD stated he felt possible start of adhesive capsulitis, pt tolerated well with good ROM. also discussed 1-2# free wt ex at home for strengthening and possible transiton to independant gym in 2 weeks of so to decrease freq of PT to 1 x a week    PT Treatment/Interventions ADLs/Self Care Home Management;Cryotherapy;Electrical Stimulation;Therapeutic activities;Therapeutic exercise;Manual techniques;Patient/family education;Vasopneumatic Device    PT Next Visit Plan progress strength and ROM           Patient will benefit from skilled therapeutic intervention in order to improve the following deficits and impairments:  Decreased range of motion,Impaired UE functional use,Increased muscle spasms,Pain,Improper body mechanics,Postural dysfunction,Decreased strength  Visit Diagnosis: Stiffness of right shoulder, not elsewhere classified  Cramp and spasm     Problem List Patient Active Problem List   Diagnosis Date Noted  . Hyperlipidemia 01/12/2020  . GERD (gastroesophageal reflux disease)  06/27/2014  . Groin mass in female 01/26/2012  . Physical exam 04/13/2011  . Screening for malignant neoplasm of cervix 04/13/2011  . RLS (restless legs syndrome) 04/13/2011  . Breast cancer screening by mammogram 04/13/2011  . PETECHIAE 06/18/2009  . VARICOSE VEINS, LOWER EXTREMITIES 11/01/2008  . Anxiety and depression 10/10/2008  . Osteoporosis 10/10/2008    Harish Bram,ANGIE PTA 07/23/2020, 2:41 PM  [place]. [place], [place], [address] Phone: [phone]   Fax:  [phone]  Name: Caya Reifschneider MRN: [id number] Date of Birth: [birth year]

## 2020-07-25 ENCOUNTER — Other Ambulatory Visit: Payer: Self-pay

## 2020-07-25 ENCOUNTER — Ambulatory Visit: Payer: 59 | Admitting: Physical Therapy

## 2020-07-25 DIAGNOSIS — R252 Cramp and spasm: Secondary | ICD-10-CM

## 2020-07-25 DIAGNOSIS — M25611 Stiffness of right shoulder, not elsewhere classified: Secondary | ICD-10-CM

## 2020-07-25 NOTE — Therapy (Signed)
[place]. [place], [place], [address] Phone: [phone]   Fax:  [phone]  Physical Therapy Treatment  Patient Details  Name: Alexa Proctor MRN: [id number] Date of Birth: 1956-06-13 Referring Provider (PT): Jerry Caras Date: 07/25/2020   PT End of Session - 07/25/20 1607    Visit Number 18    Date for PT Re-Evaluation 08/16/20    PT Start Time 9563    PT Stop Time 1610    PT Time Calculation (min) 40 min           Past Medical History:  Diagnosis Date  . Depression   . Groin mass in female   . Osteoporosis     Past Surgical History:  Procedure Laterality Date  . BREAST SURGERY  03/1992   implants  . HERNIA REPAIR    . right groin mass  03/24/12  . SHOULDER ARTHROSCOPY WITH ROTATOR CUFF REPAIR AND OPEN BICEPS TENODESIS Right 04/25/2020   Procedure: RIGHT SHOULDER ARTHROSCOPY, DEBRIDEMENT, MINI OPEN ROTATOR CUFF TEAR REPAIR AND OPEN BICEPS TENODESIS;  Surgeon: Meredith Pel, MD;  Location: [facility];  Service: Orthopedics;  Laterality: Right;  . TUBAL LIGATION  08/1990    There were no vitals filed for this visit.   Subjective Assessment - 07/25/20 1524    Subjective doing better an dbetter- reaching further with actvities    Currently in Pain? No/denies              [facility] PT Assessment - 07/25/20 0001      AROM   AROM Assessment Site Shoulder    Right/Left Shoulder Right   standing   Right Shoulder Flexion 165 Degrees    Right Shoulder ABduction 150 Degrees                         [facility] Adult PT Treatment/Exercise - 07/25/20 0001      Shoulder Exercises: Prone   Other Prone Exercises Quadreped flex and abd 10 each      Shoulder Exercises: Standing   External Rotation Strengthening;Right;20 reps   5# pulleys with assistance   Internal Rotation Strengthening;Right;20 reps;Weights   5#pulley   Flexion Strengthening;Both;15 reps;Weights     Shoulder Flexion Weight (lbs) 3    ABduction Strengthening;15 reps;Weights    Shoulder ABduction Weight (lbs) 2    Other Standing Exercises wt ball OH ext 2 sets 10      Shoulder Exercises: ROM/Strengthening   UBE (Upper Arm Bike) L 4 3 fwd/3back    Lat Pull 20 reps   25#   Cybex Press 20 reps   15#   Cybex Row 20 reps   25#     Manual Therapy   Manual Therapy Joint mobilization;Soft tissue mobilization;Passive ROM    Joint Mobilization to increase ROM    Soft tissue mobilization to increase ROM    Passive ROM RT shld all directions                    PT Short Term Goals - 05/28/20 1543      PT SHORT TERM GOAL #1   Title independent with initial HEP    Status Achieved             PT Long Term Goals - 07/16/20 1547      PT LONG TERM GOAL #1   Title minimal to no pain with ADL's  Status Partially Met      PT LONG TERM GOAL #2   Title no difficulty with dressing or doing hair    Status Partially Met      PT LONG TERM GOAL #3   Title increase AROM of the right shoulder flexion to 150    Status Achieved      PT LONG TERM GOAL #4   Title increase AROM of ER to 80 degrees    Status Partially Met                 Plan - 07/25/20 1608    Clinical Impression Statement pt with improved AROM and abel to tolerated PROM to end ranges. progressing with strengthening    PT Treatment/Interventions ADLs/Self Care Home Management;Cryotherapy;Electrical Stimulation;Therapeutic activities;Therapeutic exercise;Manual techniques;Patient/family education;Vasopneumatic Device    PT Next Visit Plan progress strength and ROM           Patient will benefit from skilled therapeutic intervention in order to improve the following deficits and impairments:  Decreased range of motion,Impaired UE functional use,Increased muscle spasms,Pain,Improper body mechanics,Postural dysfunction,Decreased strength  Visit Diagnosis: Stiffness of right shoulder, not elsewhere  classified  Cramp and spasm     Problem List Patient Active Problem List   Diagnosis Date Noted  . Hyperlipidemia 01/12/2020  . GERD (gastroesophageal reflux disease) 06/27/2014  . Groin mass in female 01/26/2012  . Physical exam 04/13/2011  . Screening for malignant neoplasm of cervix 04/13/2011  . RLS (restless legs syndrome) 04/13/2011  . Breast cancer screening by mammogram 04/13/2011  . PETECHIAE 06/18/2009  . VARICOSE VEINS, LOWER EXTREMITIES 11/01/2008  . Anxiety and depression 10/10/2008  . Osteoporosis 10/10/2008    Negan Grudzien,ANGIE PTA 07/25/2020, 4:10 PM  [place]. [place], [place], [address] Phone: [phone]   Fax:  [phone]  Name: Alexa Proctor MRN: [id number] Date of Birth: [birth year]

## 2020-07-30 ENCOUNTER — Ambulatory Visit: Payer: 59 | Attending: Surgical | Admitting: Physical Therapy

## 2020-07-30 ENCOUNTER — Other Ambulatory Visit: Payer: Self-pay

## 2020-07-30 DIAGNOSIS — R252 Cramp and spasm: Secondary | ICD-10-CM | POA: Insufficient documentation

## 2020-07-30 DIAGNOSIS — M25611 Stiffness of right shoulder, not elsewhere classified: Secondary | ICD-10-CM | POA: Insufficient documentation

## 2020-07-30 NOTE — Therapy (Signed)
[place]. [place], [place], [address] Phone: [phone]   Fax:  [phone]  Physical Therapy Treatment  Patient Details  Name: Alexa Proctor MRN: [id number] Date of Birth: May 25, 1956 Referring Provider (PT): Jerry Caras Date: 07/30/2020   PT End of Session - 07/30/20 1435    Visit Number 19    Date for PT Re-Evaluation 08/16/20    PT Start Time 1350    PT Stop Time 1430    PT Time Calculation (min) 40 min           Past Medical History:  Diagnosis Date  . Depression   . Groin mass in female   . Osteoporosis     Past Surgical History:  Procedure Laterality Date  . BREAST SURGERY  03/1992   implants  . HERNIA REPAIR    . right groin mass  03/24/12  . SHOULDER ARTHROSCOPY WITH ROTATOR CUFF REPAIR AND OPEN BICEPS TENODESIS Right 04/25/2020   Procedure: RIGHT SHOULDER ARTHROSCOPY, DEBRIDEMENT, MINI OPEN ROTATOR CUFF TEAR REPAIR AND OPEN BICEPS TENODESIS;  Surgeon: Meredith Pel, MD;  Location: [facility];  Service: Orthopedics;  Laterality: Right;  . TUBAL LIGATION  08/1990    There were no vitals filed for this visit.   Subjective Assessment - 07/30/20 1350    Subjective doing better and more    Currently in Pain? No/denies                             [facility] Adult PT Treatment/Exercise - 07/30/20 0001      Shoulder Exercises: Standing   Other Standing Exercises inclined push up 10x      Shoulder Exercises: Pulleys   Other Pulley Exercises wt pulleys various ex      Shoulder Exercises: ROM/Strengthening   UBE (Upper Arm Bike) L 4 3 fwd/3back    Lat Pull 15 reps   2 sets 15 25#   Pec Fly 1 plate   2 sets 5 with assistance   Cybex Press 20 reps   15# with serratus   Cybex Row 15 reps   2 sets 25#   Other ROM/Strengthening Exercises tricep ext 25# 2 sets 10, bicep curl 15# 2 sets10      Manual Therapy   Manual Therapy Joint mobilization;Soft tissue  mobilization;Passive ROM    Manual therapy comments end range tighness and some pain but overall very good ROM    Joint Mobilization to increase ROM    Soft tissue mobilization to increase ROM    Passive ROM RT shld all directions                    PT Short Term Goals - 05/28/20 1543      PT SHORT TERM GOAL #1   Title independent with initial HEP    Status Achieved             PT Long Term Goals - 07/16/20 1547      PT LONG TERM GOAL #1   Title minimal to no pain with ADL's    Status Partially Met      PT LONG TERM GOAL #2   Title no difficulty with dressing or doing hair    Status Partially Met      PT LONG TERM GOAL #3   Title increase AROM of the right shoulder flexion to 150    Status  Achieved      PT LONG TERM GOAL #4   Title increase AROM of ER to 80 degrees    Status Partially Met                 Plan - 07/30/20 1435    Clinical Impression Statement added new ex and needed cuing and assistance d/t weakness and fatigue. AAROM without wt and PROM is very good- weakness is biggest issue.    PT Treatment/Interventions ADLs/Self Care Home Management;Cryotherapy;Electrical Stimulation;Therapeutic activities;Therapeutic exercise;Manual techniques;Patient/family education;Vasopneumatic Device    PT Next Visit Plan progress strength and ROM           Patient will benefit from skilled therapeutic intervention in order to improve the following deficits and impairments:  Decreased range of motion,Impaired UE functional use,Increased muscle spasms,Pain,Improper body mechanics,Postural dysfunction,Decreased strength  Visit Diagnosis: Stiffness of right shoulder, not elsewhere classified  Cramp and spasm     Problem List Patient Active Problem List   Diagnosis Date Noted  . Hyperlipidemia 01/12/2020  . GERD (gastroesophageal reflux disease) 06/27/2014  . Groin mass in female 01/26/2012  . Physical exam 04/13/2011  . Screening for malignant  neoplasm of cervix 04/13/2011  . RLS (restless legs syndrome) 04/13/2011  . Breast cancer screening by mammogram 04/13/2011  . PETECHIAE 06/18/2009  . VARICOSE VEINS, LOWER EXTREMITIES 11/01/2008  . Anxiety and depression 10/10/2008  . Osteoporosis 10/10/2008    Jenavi Beedle,ANGIE PTA 07/30/2020, 2:37 PM  [place]. [place], [place], [address] Phone: [phone]   Fax:  [phone]  Name: Alexa Proctor MRN: [id number] Date of Birth: 25-Aug-1955

## 2020-08-01 ENCOUNTER — Ambulatory Visit: Payer: 59 | Admitting: Physical Therapy

## 2020-08-06 ENCOUNTER — Ambulatory Visit: Payer: 59 | Admitting: Physical Therapy

## 2020-08-06 ENCOUNTER — Other Ambulatory Visit: Payer: Self-pay

## 2020-08-06 DIAGNOSIS — M25611 Stiffness of right shoulder, not elsewhere classified: Secondary | ICD-10-CM | POA: Diagnosis not present

## 2020-08-06 NOTE — Therapy (Signed)
[place]. [place], [place], [address] Phone: [phone]   Fax:  [phone]  Physical Therapy Treatment Progress Note Reporting Period 06/25/2020 to 08/06/2020  See note below for Objective Data and Assessment of Progress/Goals.      Patient Details  Name: Alexa Proctor MRN: [id number] Date of Birth: 1955/08/30 Referring Provider (PT): Jerry Caras Date: 08/06/2020   PT End of Session - 08/06/20 1441    Visit Number 20    Date for PT Re-Evaluation 08/16/20    PT Start Time 1350    PT Stop Time 1432    PT Time Calculation (min) 42 min           Past Medical History:  Diagnosis Date  . Depression   . Groin mass in female   . Osteoporosis     Past Surgical History:  Procedure Laterality Date  . BREAST SURGERY  03/1992   implants  . HERNIA REPAIR    . right groin mass  03/24/12  . SHOULDER ARTHROSCOPY WITH ROTATOR CUFF REPAIR AND OPEN BICEPS TENODESIS Right 04/25/2020   Procedure: RIGHT SHOULDER ARTHROSCOPY, DEBRIDEMENT, MINI OPEN ROTATOR CUFF TEAR REPAIR AND OPEN BICEPS TENODESIS;  Surgeon: Meredith Pel, MD;  Location: [facility];  Service: Orthopedics;  Laterality: Right;  . TUBAL LIGATION  08/1990    There were no vitals filed for this visit.   Subjective Assessment - 08/06/20 1352    Subjective " strength makes such a difference"    Currently in Pain? No/denies              [facility] PT Assessment - 08/06/20 0001      AROM   Right/Left Shoulder --   standing                        [facility] Adult PT Treatment/Exercise - 08/06/20 0001      Shoulder Exercises: Pulleys   Other Pulley Exercises wt pulleys various ex      Shoulder Exercises: ROM/Strengthening   UBE (Upper Arm Bike) L 6 2 fwd/2back    Nustep L 6 push /pull 3 min and laterally 3 min    Lat Pull 15 reps   2 sets 15   Cybex Press 20 reps   15# with serratus   Cybex Row 15 reps   2 sets  25#   Other ROM/Strengthening Exercises tricep ext 25# 2 sets 10, bicep curl 15# 2 sets10      Manual Therapy   Manual Therapy Joint mobilization;Soft tissue mobilization;Passive ROM    Manual therapy comments end range tighness and some pain but overall very good ROM    Joint Mobilization to increase ROM    Soft tissue mobilization to increase ROM    Passive ROM RT shld all directions                    PT Short Term Goals - 05/28/20 1543      PT SHORT TERM GOAL #1   Title independent with initial HEP    Status Achieved             PT Long Term Goals - 07/16/20 1547      PT LONG TERM GOAL #1   Title minimal to no pain with ADL's    Status Partially Met      PT LONG TERM GOAL #2   Title no difficulty with dressing  or doing hair    Status Partially Met      PT LONG TERM GOAL #3   Title increase AROM of the right shoulder flexion to 150    Status Achieved      PT LONG TERM GOAL #4   Title increase AROM of ER to 80 degrees    Status Partially Met                 Plan - 08/06/20 1441    Clinical Impression Statement progressed ther ex and did well- fatigue and cued to decrease compensations. attemetd AROM at end of session but too fatigued    PT Treatment/Interventions ADLs/Self Care Home Management;Cryotherapy;Electrical Stimulation;Therapeutic activities;Therapeutic exercise;Manual techniques;Patient/family education;Vasopneumatic Device    PT Next Visit Plan progress strength and ROM-chekc ROM anf goals           Patient will benefit from skilled therapeutic intervention in order to improve the following deficits and impairments:  Decreased range of motion,Impaired UE functional use,Increased muscle spasms,Pain,Improper body mechanics,Postural dysfunction,Decreased strength  Visit Diagnosis: Stiffness of right shoulder, not elsewhere classified     Problem List Patient Active Problem List   Diagnosis Date Noted  . Hyperlipidemia  01/12/2020  . GERD (gastroesophageal reflux disease) 06/27/2014  . Groin mass in female 01/26/2012  . Physical exam 04/13/2011  . Screening for malignant neoplasm of cervix 04/13/2011  . RLS (restless legs syndrome) 04/13/2011  . Breast cancer screening by mammogram 04/13/2011  . PETECHIAE 06/18/2009  . VARICOSE VEINS, LOWER EXTREMITIES 11/01/2008  . Anxiety and depression 10/10/2008  . Osteoporosis 10/10/2008   Amador Cunas, PT, DPT Laqueta Carina PTA 08/06/2020, 2:51 PM  [place]. [place], [place], [address] Phone: [phone]   Fax:  [phone]  Name: Alexa Proctor MRN: [id number] Date of Birth: [birth year]

## 2020-08-08 ENCOUNTER — Other Ambulatory Visit: Payer: Self-pay

## 2020-08-08 ENCOUNTER — Ambulatory Visit: Payer: 59 | Admitting: Physical Therapy

## 2020-08-08 DIAGNOSIS — M25611 Stiffness of right shoulder, not elsewhere classified: Secondary | ICD-10-CM

## 2020-08-08 DIAGNOSIS — R252 Cramp and spasm: Secondary | ICD-10-CM

## 2020-08-08 NOTE — Therapy (Signed)
[place]. [place], [place], [address] Phone: [phone]   Fax:  [phone]  Physical Therapy Treatment  Patient Details  Name: Alexa Proctor MRN: [id number] Date of Birth: 12-Dec-1955 Referring Provider (PT): Jerry Caras Date: 08/08/2020   PT End of Session - 08/08/20 1436    Visit Number 21    Date for PT Re-Evaluation 08/16/20    PT Start Time 1350    PT Stop Time 8756    PT Time Calculation (min) 55 min           Past Medical History:  Diagnosis Date  . Depression   . Groin mass in female   . Osteoporosis     Past Surgical History:  Procedure Laterality Date  . BREAST SURGERY  03/1992   implants  . HERNIA REPAIR    . right groin mass  03/24/12  . SHOULDER ARTHROSCOPY WITH ROTATOR CUFF REPAIR AND OPEN BICEPS TENODESIS Right 04/25/2020   Procedure: RIGHT SHOULDER ARTHROSCOPY, DEBRIDEMENT, MINI OPEN ROTATOR CUFF TEAR REPAIR AND OPEN BICEPS TENODESIS;  Surgeon: Meredith Pel, MD;  Location: [facility];  Service: Orthopedics;  Laterality: Right;  . TUBAL LIGATION  08/1990    There were no vitals filed for this visit.   Subjective Assessment - 08/08/20 1358    Subjective alittle more muscle soreness but fine now    Currently in Pain? No/denies              [facility] PT Assessment - 08/08/20 0001      AROM   AROM Assessment Site Shoulder    Right/Left Shoulder Right   standing   Right Shoulder Flexion 168 Degrees    Right Shoulder ABduction 150 Degrees      Strength   Overall Strength Comments standing arm strength 4+/5 90 and below                         [facility] Adult PT Treatment/Exercise - 08/08/20 0001      Shoulder Exercises: Prone   Other Prone Exercises [place] ex 2# and 3#      Shoulder Exercises: Standing   Flexion Strengthening;Right;20 reps;Theraband    ABduction Strengthening;Right;20 reps;Theraband      Shoulder Exercises: Pulleys    Other Pulley Exercises wt pulleys various ex    Other Pulley Exercises abd and abd w/ER 5# with assitance      Shoulder Exercises: ROM/Strengthening   UBE (Upper Arm Bike) L 6 3 fwd/3back    Lat Pull 15 reps   2 sets 25#   Cybex Row 15 reps   2 sets 25#   Other ROM/Strengthening Exercises fly 5# 2 sets 10 assistance needed      Manual Therapy   Passive ROM RT shld all directions                    PT Short Term Goals - 05/28/20 1543      PT SHORT TERM GOAL #1   Title independent with initial HEP    Status Achieved             PT Long Term Goals - 08/08/20 1443      PT LONG TERM GOAL #1   Title minimal to no pain with ADL's    Status Partially Met      PT LONG TERM GOAL #2   Title no difficulty with dressing or doing hair  Status Partially Met      PT LONG TERM GOAL #3   Title increase AROM of the right shoulder flexion to 150    Status Achieved      PT LONG TERM GOAL #4   Title increase AROM of ER to 80 degrees    Status Partially Met                 Plan - 08/08/20 1442    Clinical Impression Statement ROM aboutt the same but strength is improving esp 90 and below. pt is making progress and looking to transition into a gym either here or community based    PT Treatment/Interventions ADLs/Self Care Home Management;Cryotherapy;Electrical Stimulation;Therapeutic activities;Therapeutic exercise;Manual techniques;Patient/family education;Vasopneumatic Device    PT Next Visit Plan progress strength and ROM           Patient will benefit from skilled therapeutic intervention in order to improve the following deficits and impairments:  Decreased range of motion,Impaired UE functional use,Increased muscle spasms,Pain,Improper body mechanics,Postural dysfunction,Decreased strength  Visit Diagnosis: Stiffness of right shoulder, not elsewhere classified  Cramp and spasm     Problem List Patient Active Problem List   Diagnosis Date Noted  .  Hyperlipidemia 01/12/2020  . GERD (gastroesophageal reflux disease) 06/27/2014  . Groin mass in female 01/26/2012  . Physical exam 04/13/2011  . Screening for malignant neoplasm of cervix 04/13/2011  . RLS (restless legs syndrome) 04/13/2011  . Breast cancer screening by mammogram 04/13/2011  . PETECHIAE 06/18/2009  . VARICOSE VEINS, LOWER EXTREMITIES 11/01/2008  . Anxiety and depression 10/10/2008  . Osteoporosis 10/10/2008    PAYSEUR,ANGIE PTA 08/08/2020, 2:44 PM  [place]. [place], [place], [address] Phone: [phone]   Fax:  [phone]  Name: Alexa Proctor MRN: [id number] Date of Birth: 24-Jan-1956

## 2020-08-13 ENCOUNTER — Ambulatory Visit: Payer: 59 | Admitting: Physical Therapy

## 2020-08-13 ENCOUNTER — Other Ambulatory Visit: Payer: Self-pay

## 2020-08-13 DIAGNOSIS — M25611 Stiffness of right shoulder, not elsewhere classified: Secondary | ICD-10-CM

## 2020-08-13 NOTE — Therapy (Signed)
[place]. [place], [place], [address] Phone: [phone]   Fax:  [phone]  Physical Therapy Treatment  Patient Details  Name: Alexa Proctor MRN: [id number] Date of Birth: 01/29/1956 Referring Provider (PT): Jerry Caras Date: 08/13/2020   PT End of Session - 08/13/20 1440    Visit Number 22    Date for PT Re-Evaluation 08/16/20    PT Start Time 1400    PT Stop Time 1440    PT Time Calculation (min) 40 min           Past Medical History:  Diagnosis Date  . Depression   . Groin mass in female   . Osteoporosis     Past Surgical History:  Procedure Laterality Date  . BREAST SURGERY  03/1992   implants  . HERNIA REPAIR    . right groin mass  03/24/12  . SHOULDER ARTHROSCOPY WITH ROTATOR CUFF REPAIR AND OPEN BICEPS TENODESIS Right 04/25/2020   Procedure: RIGHT SHOULDER ARTHROSCOPY, DEBRIDEMENT, MINI OPEN ROTATOR CUFF TEAR REPAIR AND OPEN BICEPS TENODESIS;  Surgeon: Meredith Pel, MD;  Location: [facility];  Service: Orthopedics;  Laterality: Right;  . TUBAL LIGATION  08/1990    There were no vitals filed for this visit.   Subjective Assessment - 08/13/20 1400    Subjective doing well    Currently in Pain? No/denies                             [facility] Adult PT Treatment/Exercise - 08/13/20 0001      Shoulder Exercises: Standing   Flexion Strengthening;Right;20 reps;Weights    Shoulder Flexion Weight (lbs) 3    ABduction Strengthening;Right;20 reps;Weights    Shoulder ABduction Weight (lbs) 3    Diagonals Strengthening;Right;20 reps;Weights    Diagonals Weight (lbs) 3    Other Standing Exercises 4 pt rhy stab 2 sets 10 with Assistance      Shoulder Exercises: ROM/Strengthening   UBE (Upper Arm Bike) L 6 3 fwd/3back    Lat Pull 10 reps   2 sets 35#   Cybex Press 20 reps   20# with serratus   Cybex Row 10 reps   2 sets 35#   Other ROM/Strengthening  Exercises tricep ext 25# 2 sets 10, bicep curl 15# 2 sets10      Manual Therapy   Passive ROM aggressive all directions taking to end range                    PT Short Term Goals - 05/28/20 1543      PT SHORT TERM GOAL #1   Title independent with initial HEP    Status Achieved             PT Long Term Goals - 08/08/20 1443      PT LONG TERM GOAL #1   Title minimal to no pain with ADL's    Status Partially Met      PT LONG TERM GOAL #2   Title no difficulty with dressing or doing hair    Status Partially Met      PT LONG TERM GOAL #3   Title increase AROM of the right shoulder flexion to 150    Status Achieved      PT LONG TERM GOAL #4   Title increase AROM of ER to 80 degrees    Status Partially Met  Plan - 08/13/20 1440    Clinical Impression Statement increased wt and added ther ex with some assitance to achieve full ROM. aggressive PROM with end range hold    PT Treatment/Interventions ADLs/Self Care Home Management;Cryotherapy;Electrical Stimulation;Therapeutic activities;Therapeutic exercise;Manual techniques;Patient/family education;Vasopneumatic Device    PT Next Visit Plan pt to check on gym options. do renewal at 1 x a week           Patient will benefit from skilled therapeutic intervention in order to improve the following deficits and impairments:  Decreased range of motion,Impaired UE functional use,Increased muscle spasms,Pain,Improper body mechanics,Postural dysfunction,Decreased strength  Visit Diagnosis: Stiffness of right shoulder, not elsewhere classified     Problem List Patient Active Problem List   Diagnosis Date Noted  . Hyperlipidemia 01/12/2020  . GERD (gastroesophageal reflux disease) 06/27/2014  . Groin mass in female 01/26/2012  . Physical exam 04/13/2011  . Screening for malignant neoplasm of cervix 04/13/2011  . RLS (restless legs syndrome) 04/13/2011  . Breast cancer screening by mammogram  04/13/2011  . PETECHIAE 06/18/2009  . VARICOSE VEINS, LOWER EXTREMITIES 11/01/2008  . Anxiety and depression 10/10/2008  . Osteoporosis 10/10/2008    Dave Mannes,ANGIE PTA 08/13/2020, 2:42 PM  [name]. [place], [place], [address] Phone: [phone]   Fax:  [phone]  Name: Alexa Proctor MRN: [id number] Date of Birth: [birth year]

## 2020-08-15 ENCOUNTER — Ambulatory Visit: Payer: 59 | Admitting: Physical Therapy

## 2020-08-15 ENCOUNTER — Other Ambulatory Visit: Payer: Self-pay

## 2020-08-15 DIAGNOSIS — M25611 Stiffness of right shoulder, not elsewhere classified: Secondary | ICD-10-CM | POA: Diagnosis not present

## 2020-08-15 DIAGNOSIS — R252 Cramp and spasm: Secondary | ICD-10-CM

## 2020-08-15 NOTE — Therapy (Signed)
[place]. [place], [place], [address] Phone: [phone]   Fax:  [phone]  Physical Therapy Treatment  Patient Details  Name: Alexa Proctor MRN: [id number] Date of Birth: 10-19-1955 Referring Provider (PT): Jerry Caras Date: 08/15/2020   PT End of Session - 08/15/20 1430    Visit Number 23    Date for PT Re-Evaluation 09/15/20    PT Start Time 6546    PT Stop Time 1430    PT Time Calculation (min) 35 min           Past Medical History:  Diagnosis Date  . Depression   . Groin mass in female   . Osteoporosis     Past Surgical History:  Procedure Laterality Date  . BREAST SURGERY  03/1992   implants  . HERNIA REPAIR    . right groin mass  03/24/12  . SHOULDER ARTHROSCOPY WITH ROTATOR CUFF REPAIR AND OPEN BICEPS TENODESIS Right 04/25/2020   Procedure: RIGHT SHOULDER ARTHROSCOPY, DEBRIDEMENT, MINI OPEN ROTATOR CUFF TEAR REPAIR AND OPEN BICEPS TENODESIS;  Surgeon: Meredith Pel, MD;  Location: [facility];  Service: Orthopedics;  Laterality: Right;  . TUBAL LIGATION  08/1990    There were no vitals filed for this visit.   Subjective Assessment - 08/15/20 1346    Subjective alittle more sore but doing okay. Pt arrived with pictures of [place] for independant gym    Currently in Pain? No/denies                             [facility] Adult PT Treatment/Exercise - 08/15/20 0001      Shoulder Exercises: Pulleys   Other Pulley Exercises wt pulleys various ex      Shoulder Exercises: ROM/Strengthening   UBE (Upper Arm Bike) L 6 3 fwd/3back    Lat Pull 10 reps   2 sets 35#   Cybex Row 10 reps   2 sets 35 #   Other ROM/Strengthening Exercises tricep ext 25# 2 sets 15, bicep curl 15# 2 sets15      Manual Therapy   Passive ROM aggressive all directions taking to end range                  PT Education - 08/15/20 1429    Education Details issued ex  for independant gym ex    Person(s) Educated Patient    Methods Explanation;Demonstration;Handout    Comprehension Verbalized understanding;Returned demonstration            PT Short Term Goals - 05/28/20 1543      PT SHORT TERM GOAL #1   Title independent with initial HEP    Status Achieved             PT Long Term Goals - 08/08/20 1443      PT LONG TERM GOAL #1   Title minimal to no pain with ADL's    Status Partially Met      PT LONG TERM GOAL #2   Title no difficulty with dressing or doing hair    Status Partially Met      PT LONG TERM GOAL #3   Title increase AROM of the right shoulder flexion to 150    Status Achieved      PT LONG TERM GOAL #4   Title increase AROM of ER to 80 degrees    Status Partially Met  Plan - 08/15/20 1430    Clinical Impression Statement educ pt on gym ex wt and adjusting equipment with handout provided    PT Frequency 1x / week    PT Duration 4 weeks    PT Treatment/Interventions ADLs/Self Care Home Management;Cryotherapy;Electrical Stimulation;Therapeutic activities;Therapeutic exercise;Manual techniques;Patient/family education;Vasopneumatic Device    PT Next Visit Plan renewed 1 x a week for 4 monthes and transition to gym           Patient will benefit from skilled therapeutic intervention in order to improve the following deficits and impairments:  Decreased range of motion,Impaired UE functional use,Increased muscle spasms,Pain,Improper body mechanics,Postural dysfunction,Decreased strength  Visit Diagnosis: Stiffness of right shoulder, not elsewhere classified  Cramp and spasm     Problem List Patient Active Problem List   Diagnosis Date Noted  . Hyperlipidemia 01/12/2020  . GERD (gastroesophageal reflux disease) 06/27/2014  . Groin mass in female 01/26/2012  . Physical exam 04/13/2011  . Screening for malignant neoplasm of cervix 04/13/2011  . RLS (restless legs syndrome) 04/13/2011  .  Breast cancer screening by mammogram 04/13/2011  . PETECHIAE 06/18/2009  . VARICOSE VEINS, LOWER EXTREMITIES 11/01/2008  . Anxiety and depression 10/10/2008  . Osteoporosis 10/10/2008    Keyonte Cookston,ANGIE PTA 08/15/2020, 2:32 PM  [place]. [place], [place], [address] Phone: [phone]   Fax:  [phone]  Name: Alexa Proctor MRN: [id number] Date of Birth: 01-03-1956

## 2020-08-20 ENCOUNTER — Ambulatory Visit: Payer: 59 | Admitting: Physical Therapy

## 2020-08-20 DIAGNOSIS — M25611 Stiffness of right shoulder, not elsewhere classified: Secondary | ICD-10-CM | POA: Diagnosis not present

## 2020-08-20 NOTE — Therapy (Signed)
[place]. [place], [place], [address] Phone: [phone]   Fax:  [phone]  Physical Therapy Treatment  Patient Details  Name: Alexa Proctor MRN: [id number] Date of Birth: 1956-01-20 Referring Provider (PT): Alexa Proctor Date: 08/20/2020   PT End of Session - 08/20/20 0488    Visit Number 24    Date for PT Re-Evaluation 09/15/20    PT Start Time 8916    PT Stop Time 1435    PT Time Calculation (min) 40 min           Past Medical History:  Diagnosis Date  . Depression   . Groin mass in female   . Osteoporosis     Past Surgical History:  Procedure Laterality Date  . BREAST SURGERY  03/1992   implants  . HERNIA REPAIR    . right groin mass  03/24/12  . SHOULDER ARTHROSCOPY WITH ROTATOR CUFF REPAIR AND OPEN BICEPS TENODESIS Right 04/25/2020   Procedure: RIGHT SHOULDER ARTHROSCOPY, DEBRIDEMENT, MINI OPEN ROTATOR CUFF TEAR REPAIR AND OPEN BICEPS TENODESIS;  Surgeon: Alexa Pel, MD;  Location: [facility];  Service: Orthopedics;  Laterality: Right;  . TUBAL LIGATION  08/1990    There were no vitals filed for this visit.   Subjective Assessment - 08/20/20 1355    Subjective feeling good- will start at gym this week    Currently in Pain? No/denies              [facility] PT Assessment - 08/20/20 0001      AROM   AROM Assessment Site Shoulder    Right/Left Shoulder Right   standing   Right Shoulder Flexion 172 Degrees    Right Shoulder ABduction 160 Degrees    Right Shoulder Internal Rotation 73 Degrees    Right Shoulder External Rotation 83 Degrees                         [facility] Adult PT Treatment/Exercise - 08/20/20 0001      Shoulder Exercises: Supine   Flexion Strengthening;Right;10 reps    Shoulder Flexion Weight (lbs) 4    ABduction Strengthening;Right;10 reps;Weights    Shoulder ABduction Weight (lbs) 4      Shoulder Exercises: Standing   Other  Standing Exercises tricep25# 2 sets 10, bicep 15# 2 sets    Other Standing Exercises cabinet reaching 2-3#, rhy stab      Shoulder Exercises: Pulleys   Other Pulley Exercises wt pulleys various ex      Shoulder Exercises: ROM/Strengthening   UBE (Upper Arm Bike) L 6 3 fwd/3back    Lat Pull 10 reps   2 sets 35#   Cybex Row 10 reps   2 sets 35#     Manual Therapy   Passive ROM aggressive all directions taking to end range   achieved full PROM                 PT Education - 08/20/20 1443    Education Details reviewd and answered questions for HEP at gym and home    Person(s) Educated Patient    Methods Explanation;Demonstration    Comprehension Verbalized understanding;Returned demonstration            PT Short Term Goals - 05/28/20 1543      PT SHORT TERM GOAL #1   Title independent with initial HEP    Status Achieved  PT Long Term Goals - 08/20/20 1444      PT LONG TERM GOAL #1   Title minimal to no pain with ADL's    Status Partially Met      PT LONG TERM GOAL #2   Title no difficulty with dressing or doing hair    Status Partially Met      PT LONG TERM GOAL #3   Title increase AROM of the right shoulder flexion to 150    Status Achieved      PT LONG TERM GOAL #4   Title increase AROM of ER to 80 degrees    Status Partially Met                 Plan - 08/20/20 1444    Clinical Impression Statement pt doing very well and transitioning to 1 x a week and starting at gym, reviewed gym ex and HEP. progressing with LTGs. FULL PROM needs to work strength above 90 degrees    PT Treatment/Interventions ADLs/Self Care Home Management;Cryotherapy;Electrical Stimulation;Therapeutic activities;Therapeutic exercise;Manual techniques;Patient/family education;Vasopneumatic Device    PT Next Visit Plan strength above 90 degrees           Patient will benefit from skilled therapeutic intervention in order to improve the following deficits and  impairments:  Decreased range of motion,Impaired UE functional use,Increased muscle spasms,Pain,Improper body mechanics,Postural dysfunction,Decreased strength  Visit Diagnosis: Stiffness of right shoulder, not elsewhere classified     Problem List Patient Active Problem List   Diagnosis Date Noted  . Hyperlipidemia 01/12/2020  . GERD (gastroesophageal reflux disease) 06/27/2014  . Groin mass in female 01/26/2012  . Physical exam 04/13/2011  . Screening for malignant neoplasm of cervix 04/13/2011  . RLS (restless legs syndrome) 04/13/2011  . Breast cancer screening by mammogram 04/13/2011  . PETECHIAE 06/18/2009  . VARICOSE VEINS, LOWER EXTREMITIES 11/01/2008  . Anxiety and depression 10/10/2008  . Osteoporosis 10/10/2008    Alexa Proctor,Alexa Proctor 08/20/2020, 2:46 PM  [address]. [place], [place], [address] Phone: [phone]   Fax:  [phone]  Name: Alexa Proctor MRN: [id number] Date of Birth: Alexa Proctor 27, 1957

## 2020-08-27 ENCOUNTER — Ambulatory Visit: Payer: 59 | Attending: Surgical | Admitting: Physical Therapy

## 2020-08-27 ENCOUNTER — Other Ambulatory Visit: Payer: Self-pay

## 2020-08-27 DIAGNOSIS — M25611 Stiffness of right shoulder, not elsewhere classified: Secondary | ICD-10-CM | POA: Insufficient documentation

## 2020-08-27 DIAGNOSIS — R252 Cramp and spasm: Secondary | ICD-10-CM | POA: Insufficient documentation

## 2020-08-27 NOTE — Therapy (Signed)
[place]. [place], [place], [address] Phone: [phone]   Fax:  [phone]  Physical Therapy Treatment  Patient Details  Name: Alexa Proctor MRN: [id number] Date of Birth: [birth year] Referring Provider (PT): Jerry Caras Date: 08/27/2020   PT End of Session - 08/27/20 1446    Visit Number 25    Date for PT Re-Evaluation 09/15/20    PT Start Time 1400    PT Stop Time 1445    PT Time Calculation (min) 45 min           Past Medical History:  Diagnosis Date  . Depression   . Groin mass in female   . Osteoporosis     Past Surgical History:  Procedure Laterality Date  . BREAST SURGERY  03/1992   implants  . HERNIA REPAIR    . right groin mass  03/24/12  . SHOULDER ARTHROSCOPY WITH ROTATOR CUFF REPAIR AND OPEN BICEPS TENODESIS Right 04/25/2020   Procedure: RIGHT SHOULDER ARTHROSCOPY, DEBRIDEMENT, MINI OPEN ROTATOR CUFF TEAR REPAIR AND OPEN BICEPS TENODESIS;  Surgeon: Meredith Pel, MD;  Location: [facility];  Service: Orthopedics;  Laterality: Right;  . TUBAL LIGATION  08/1990    There were no vitals filed for this visit.   Subjective Assessment - 08/27/20 1358    Subjective went to gym and did well- just will take some getting use to    Currently in Pain? Yes    Pain Score 1     Pain Location Shoulder    Pain Orientation Right                             [facility] Adult PT Treatment/Exercise - 08/27/20 0001      Shoulder Exercises: Standing   Diagonals Limitations various func ex with and without resistance 90 degrees and above    Other Standing Exercises fucn ex with body blade and 3# wt bar RT UE only   then flex,ext and abd with BIL UE on body blade   Other Standing Exercises 1# 90 degree sup/pron 1# then 3#   2# flex,abd and lower 10 x then reverse     Shoulder Exercises: ROM/Strengthening   UBE (Upper Arm Bike) L 6 3 fwd/3back      Manual Therapy    Manual Therapy Passive ROM    Passive ROM full PROM all directions                    PT Short Term Goals - 05/28/20 1543      PT SHORT TERM GOAL #1   Title independent with initial HEP    Status Achieved             PT Long Term Goals - 08/20/20 1444      PT LONG TERM GOAL #1   Title minimal to no pain with ADL's    Status Partially Met      PT LONG TERM GOAL #2   Title no difficulty with dressing or doing hair    Status Partially Met      PT LONG TERM GOAL #3   Title increase AROM of the right shoulder flexion to 150    Status Achieved      PT LONG TERM GOAL #4   Title increase AROM of ER to 80 degrees    Status Partially Met  Plan - 08/27/20 1447    Clinical Impression Statement reviewed and answered question re: gym ex issued last session, pt had good understanding. progressed func ex 90 degrees and above for strengthening and pt did well. full PROM RT shld    PT Next Visit Plan strength above 90 degrees, 2 add'l visits           Patient will benefit from skilled therapeutic intervention in order to improve the following deficits and impairments:     Visit Diagnosis: Stiffness of right shoulder, not elsewhere classified     Problem List Patient Active Problem List   Diagnosis Date Noted  . Hyperlipidemia 01/12/2020  . GERD (gastroesophageal reflux disease) 06/27/2014  . Groin mass in female 01/26/2012  . Physical exam 04/13/2011  . Screening for malignant neoplasm of cervix 04/13/2011  . RLS (restless legs syndrome) 04/13/2011  . Breast cancer screening by mammogram 04/13/2011  . PETECHIAE 06/18/2009  . VARICOSE VEINS, LOWER EXTREMITIES 11/01/2008  . Anxiety and depression 10/10/2008  . Osteoporosis 10/10/2008    Buzz Axel,ANGIE PTA 08/27/2020, 2:48 PM  [place]. [place], [place], [address] Phone: [phone]   Fax:  [phone]  Name: Alexa  Proctor MRN: [id number] Date of Birth: 11/14/1955

## 2020-09-03 ENCOUNTER — Other Ambulatory Visit: Payer: Self-pay

## 2020-09-03 ENCOUNTER — Ambulatory Visit: Payer: 59 | Admitting: Physical Therapy

## 2020-09-03 DIAGNOSIS — M25611 Stiffness of right shoulder, not elsewhere classified: Secondary | ICD-10-CM | POA: Diagnosis not present

## 2020-09-03 DIAGNOSIS — R252 Cramp and spasm: Secondary | ICD-10-CM

## 2020-09-03 NOTE — Therapy (Signed)
[place]. [place], [place], [address] Phone: [phone]   Fax:  [phone]  Physical Therapy Treatment  Patient Details  Name: Alexa Proctor MRN: [id number] Date of Birth: [birth year] Referring Provider (PT): Jerry Caras Date: 09/03/2020   PT End of Session - 09/03/20 1430    Visit Number 26    Date for PT Re-Evaluation 09/15/20    PT Start Time 1350    PT Stop Time 1430    PT Time Calculation (min) 40 min           Past Medical History:  Diagnosis Date  . Depression   . Groin mass in female   . Osteoporosis     Past Surgical History:  Procedure Laterality Date  . BREAST SURGERY  03/1992   implants  . HERNIA REPAIR    . right groin mass  03/24/12  . SHOULDER ARTHROSCOPY WITH ROTATOR CUFF REPAIR AND OPEN BICEPS TENODESIS Right 04/25/2020   Procedure: RIGHT SHOULDER ARTHROSCOPY, DEBRIDEMENT, MINI OPEN ROTATOR CUFF TEAR REPAIR AND OPEN BICEPS TENODESIS;  Surgeon: Meredith Pel, MD;  Location: [facility];  Service: Orthopedics;  Laterality: Right;  . TUBAL LIGATION  08/1990    There were no vitals filed for this visit.   Subjective Assessment - 09/03/20 1348    Subjective gym is going okay    Currently in Pain? No/denies              [facility] PT Assessment - 09/03/20 0001      AROM   AROM Assessment Site Shoulder    Right/Left Shoulder Right   AROM at start of care   Right Shoulder Flexion 174 Degrees    Right Shoulder ABduction 160 Degrees    Right Shoulder Internal Rotation 73 Degrees    Right Shoulder External Rotation 82 Degrees                         [facility] Adult PT Treatment/Exercise - 09/03/20 0001      Shoulder Exercises: Standing   Other Standing Exercises func standing ex with various resistance in various directions all over 90 degrees      Shoulder Exercises: ROM/Strengthening   UBE (Upper Arm Bike) L 6 3 fwd/3back      Manual Therapy    Manual Therapy Passive ROM    Passive ROM increased resistance from pt but good PROM                    PT Short Term Goals - 05/28/20 1543      PT SHORT TERM GOAL #1   Title independent with initial HEP    Status Achieved             PT Long Term Goals - 08/20/20 1444      PT LONG TERM GOAL #1   Title minimal to no pain with ADL's    Status Partially Met      PT LONG TERM GOAL #2   Title no difficulty with dressing or doing hair    Status Partially Met      PT LONG TERM GOAL #3   Title increase AROM of the right shoulder flexion to 150    Status Achieved      PT LONG TERM GOAL #4   Title increase AROM of ER to 80 degrees    Status Partially Met  Plan - 09/03/20 1431    Clinical Impression Statement AROM taken in standing at start of session which = ROM form end of last session after strteching and ex. fucn ex for strength >90    PT Treatment/Interventions ADLs/Self Care Home Management;Cryotherapy;Electrical Stimulation;Therapeutic activities;Therapeutic exercise;Manual techniques;Patient/family education;Vasopneumatic Device    PT Next Visit Plan strength above 90 degrees, [place] on hold after last session           Patient will benefit from skilled therapeutic intervention in order to improve the following deficits and impairments:  Decreased range of motion,Impaired UE functional use,Increased muscle spasms,Pain,Improper body mechanics,Postural dysfunction,Decreased strength  Visit Diagnosis: Stiffness of right shoulder, not elsewhere classified  Cramp and spasm     Problem List Patient Active Problem List   Diagnosis Date Noted  . Hyperlipidemia 01/12/2020  . GERD (gastroesophageal reflux disease) 06/27/2014  . Groin mass in female 01/26/2012  . Physical exam 04/13/2011  . Screening for malignant neoplasm of cervix 04/13/2011  . RLS (restless legs syndrome) 04/13/2011  . Breast cancer screening by mammogram  04/13/2011  . PETECHIAE 06/18/2009  . VARICOSE VEINS, LOWER EXTREMITIES 11/01/2008  . Anxiety and depression 10/10/2008  . Osteoporosis 10/10/2008    Arshi Duarte,ANGIE PTA 09/03/2020, 2:32 PM  [place]. [place], [place], [address] Phone: [phone]   Fax:  [phone]  Name: Alexa Proctor MRN: [id number] Date of Birth: 1956-06-18

## 2020-09-10 ENCOUNTER — Ambulatory Visit: Payer: 59 | Admitting: Physical Therapy

## 2020-09-10 ENCOUNTER — Other Ambulatory Visit: Payer: Self-pay

## 2020-09-10 DIAGNOSIS — M25611 Stiffness of right shoulder, not elsewhere classified: Secondary | ICD-10-CM | POA: Diagnosis not present

## 2020-09-10 NOTE — Therapy (Addendum)
[place]. [place], [place], [address] Phone: [phone]   Fax:  [phone]  Physical Therapy Treatment  Patient Details  Name: Alexa Proctor MRN: [id number] Date of Birth: 1956-03-24 Referring Provider (PT): Jerry Caras Date: 09/10/2020   PT End of Session - 09/10/20 1443    Visit Number 27    Date for PT Re-Evaluation 09/15/20    Authorization Type [employer]    PT Start Time 1400    PT Stop Time 1430    PT Time Calculation (min) 30 min           Past Medical History:  Diagnosis Date  . Depression   . Groin mass in female   . Osteoporosis     Past Surgical History:  Procedure Laterality Date  . BREAST SURGERY  03/1992   implants  . HERNIA REPAIR    . right groin mass  03/24/12  . SHOULDER ARTHROSCOPY WITH ROTATOR CUFF REPAIR AND OPEN BICEPS TENODESIS Right 04/25/2020   Procedure: RIGHT SHOULDER ARTHROSCOPY, DEBRIDEMENT, MINI OPEN ROTATOR CUFF TEAR REPAIR AND OPEN BICEPS TENODESIS;  Surgeon: Meredith Pel, MD;  Location: [facility];  Service: Orthopedics;  Laterality: Right;  . TUBAL LIGATION  08/1990    There were no vitals filed for this visit.   Subjective Assessment - 09/10/20 1405    Subjective doing okay- would like to come in 2 weeks and get ROM checked    Currently in Pain? No/denies              [facility] PT Assessment - 09/10/20 0001      AROM   AROM Assessment Site Shoulder    Right/Left Shoulder Right    Right Shoulder Flexion 177 Degrees    Right Shoulder ABduction 163 Degrees    Right Shoulder Internal Rotation 75 Degrees    Right Shoulder External Rotation 82 Degrees      Strength   Overall Strength Comments standing arm strength 4+/5 90 and below                         [facility] Adult PT Treatment/Exercise - 09/10/20 0001      Shoulder Exercises: Standing   Diagonals Limitations various func ex with and without resistance 90  degrees and above      Shoulder Exercises: ROM/Strengthening   UBE (Upper Arm Bike) L 6 3 fwd/3back      Manual Therapy   Manual Therapy Passive ROM    Passive ROM RT shld                    PT Short Term Goals - 05/28/20 1543      PT SHORT TERM GOAL #1   Title independent with initial HEP    Status Achieved             PT Long Term Goals - 09/10/20 1451      PT LONG TERM GOAL #1   Title minimal to no pain with ADL's    Status Achieved      PT LONG TERM GOAL #2   Title no difficulty with dressing or doing hair    Status Achieved      PT LONG TERM GOAL #3   Title increase AROM of the right shoulder flexion to 150    Status Achieved      PT LONG TERM GOAL #4   Title increase  AROM of ER to 80 degrees    Status Achieved                 Plan - 09/10/20 1455    Clinical Impression Statement goals met. excellent ROM, weakness over 90 and fatigue. pt will continuw wuth HEP and and gym and pop in in 3 weeks to assure good ROM as she is very fearful of regression    PT Treatment/Interventions ADLs/Self Care Home Management;Cryotherapy;Electrical Stimulation;Therapeutic activities;Therapeutic exercise;Manual techniques;Patient/family education;Vasopneumatic Device    PT Next Visit Plan HOLD           Patient will benefit from skilled therapeutic intervention in order to improve the following deficits and impairments:  Decreased range of motion,Impaired UE functional use,Increased muscle spasms,Pain,Improper body mechanics,Postural dysfunction,Decreased strength  Visit Diagnosis: Stiffness of right shoulder, not elsewhere classified     Problem List Patient Active Problem List   Diagnosis Date Noted  . Hyperlipidemia 01/12/2020  . GERD (gastroesophageal reflux disease) 06/27/2014  . Groin mass in female 01/26/2012  . Physical exam 04/13/2011  . Screening for malignant neoplasm of cervix 04/13/2011  . RLS (restless legs syndrome) 04/13/2011  .  Breast cancer screening by mammogram 04/13/2011  . PETECHIAE 06/18/2009  . VARICOSE VEINS, LOWER EXTREMITIES 11/01/2008  . Anxiety and depression 10/10/2008  . Osteoporosis 10/10/2008  PHYSICAL THERAPY DISCHARGE SUMMARY   Plan: Patient agrees to discharge.  Patient goals were met. Patient is being discharged due to meeting the stated rehab goals.  ?????       Kassiah Mccrory,ANGIE PTA 09/10/2020, 2:57 PM  [place]. [place], [place], [address] Phone: [phone]   Fax:  [phone]  Name: Alexa Proctor MRN: [id number] Date of Birth: 1956-05-03

## 2020-10-06 ENCOUNTER — Other Ambulatory Visit: Payer: Self-pay | Admitting: Family Medicine

## 2020-10-16 ENCOUNTER — Encounter: Payer: Self-pay | Admitting: Orthopedic Surgery

## 2020-10-16 ENCOUNTER — Ambulatory Visit (INDEPENDENT_AMBULATORY_CARE_PROVIDER_SITE_OTHER): Payer: 59 | Admitting: Orthopedic Surgery

## 2020-10-16 DIAGNOSIS — M75121 Complete rotator cuff tear or rupture of right shoulder, not specified as traumatic: Secondary | ICD-10-CM

## 2020-10-16 NOTE — Progress Notes (Signed)
   Post-Op Visit Note   Patient: Alexa Proctor           Date of Birth: [birth year]           MRN: [id number] Visit Date: 10/16/2020 PCP: Sheliah Hatch, MD   Assessment & Plan:  Chief Complaint:  Chief Complaint  Patient presents with  . Right Shoulder - Follow-up   Visit Diagnoses:  1. Complete tear of right rotator cuff, unspecified whether traumatic     Plan: August Saucer is a patient underwent right shoulder arthroscopy with rotator cuff tear and biceps tenodesis none 3021.  She is doing well.  Finished physical therapy 215.  She is getting stronger.  On exam her range of motion is 7101 50.  She has smooth range of motion to palpation both below and at 90 degrees of abduction.  No pain meds but occasional Advil.  Plan at this time is to continue with a home exercise program of strengthening.  Avoid a lot of heavy lifting.  Sounds like the heaviest lifting she does is lifting plates or Pyrex dishes from cabinets above shoulder level which she should be fine with at this point.  Follow-up as needed.  Follow-Up Instructions: Return if symptoms worsen or fail to improve.   Orders:  No orders of the defined types were placed in this encounter.  No orders of the defined types were placed in this encounter.   Imaging: No results found.  PMFS History: Patient Active Problem List   Diagnosis Date Noted  . Hyperlipidemia 01/12/2020  . GERD (gastroesophageal reflux disease) 06/27/2014  . Groin mass in female 01/26/2012  . Physical exam 04/13/2011  . Screening for malignant neoplasm of cervix 04/13/2011  . RLS (restless legs syndrome) 04/13/2011  . Breast cancer screening by mammogram 04/13/2011  . PETECHIAE 06/18/2009  . VARICOSE VEINS, LOWER EXTREMITIES 11/01/2008  . Anxiety and depression 10/10/2008  . Osteoporosis 10/10/2008   Past Medical History:  Diagnosis Date  . Depression   . Groin mass in female   . Osteoporosis     Family History  Problem Relation Age of Onset   . Cancer Mother        breast  . Hypertension Father   . Cancer Father        pancreatic  . Cancer Sister        colon, lung  . Breast cancer Neg Hx     Past Surgical History:  Procedure Laterality Date  . BREAST SURGERY  03/1992   implants  . HERNIA REPAIR    . right groin mass  03/24/12  . SHOULDER ARTHROSCOPY WITH ROTATOR CUFF REPAIR AND OPEN BICEPS TENODESIS Right 04/25/2020   Procedure: RIGHT SHOULDER ARTHROSCOPY, DEBRIDEMENT, MINI OPEN ROTATOR CUFF TEAR REPAIR AND OPEN BICEPS TENODESIS;  Surgeon: Cammy Copa, MD;  Location: [facility];  Service: Orthopedics;  Laterality: Right;  . TUBAL LIGATION  08/1990   Social History   Occupational History  . Not on file  Tobacco Use  . Smoking status: Former Smoker    Types: Cigarettes  . Smokeless tobacco: Never Used  Vaping Use  . Vaping Use: Every day  . Start date: 12/26/2018  Substance and Sexual Activity  . Alcohol use: Not Currently  . Drug use: No  . Sexual activity: Not on file

## 2021-01-01 ENCOUNTER — Other Ambulatory Visit: Payer: Self-pay | Admitting: Family Medicine

## 2021-01-16 ENCOUNTER — Ambulatory Visit (INDEPENDENT_AMBULATORY_CARE_PROVIDER_SITE_OTHER): Payer: 59 | Admitting: Family Medicine

## 2021-01-16 ENCOUNTER — Encounter: Payer: Self-pay | Admitting: Family Medicine

## 2021-01-16 ENCOUNTER — Other Ambulatory Visit: Payer: Self-pay

## 2021-01-16 VITALS — BP 100/70 | HR 66 | Temp 97.7°F | Resp 16 | Ht 63.0 in | Wt 109.6 lb

## 2021-01-16 DIAGNOSIS — Z1159 Encounter for screening for other viral diseases: Secondary | ICD-10-CM

## 2021-01-16 DIAGNOSIS — E785 Hyperlipidemia, unspecified: Secondary | ICD-10-CM | POA: Diagnosis not present

## 2021-01-16 DIAGNOSIS — M810 Age-related osteoporosis without current pathological fracture: Secondary | ICD-10-CM

## 2021-01-16 DIAGNOSIS — Z1211 Encounter for screening for malignant neoplasm of colon: Secondary | ICD-10-CM | POA: Diagnosis not present

## 2021-01-16 DIAGNOSIS — Z0000 Encounter for general adult medical examination without abnormal findings: Secondary | ICD-10-CM

## 2021-01-16 LAB — BASIC METABOLIC PANEL
BUN: 22 mg/dL (ref 6–23)
CO2: 27 mEq/L (ref 19–32)
Calcium: 9.4 mg/dL (ref 8.4–10.5)
Chloride: 103 mEq/L (ref 96–112)
Creatinine, Ser: 0.95 mg/dL (ref 0.40–1.20)
GFR: 63.32 mL/min (ref >60.00)
Glucose, Bld: 83 mg/dL (ref 70–99)
Potassium: 4.3 mEq/L (ref 3.5–5.1)
Sodium: 139 mEq/L (ref 135–145)

## 2021-01-16 LAB — CBC WITH DIFFERENTIAL/PLATELET
Basophils Absolute: 0.1 10*3/uL (ref 0.0–0.1)
Basophils Relative: 1.3 % (ref 0.0–3.0)
Eosinophils Absolute: 0.1 10*3/uL (ref 0.0–0.7)
Eosinophils Relative: 3.4 % (ref 0.0–5.0)
HCT: 41 % (ref 36.0–46.0)
Hemoglobin: 13.9 g/dL (ref 12.0–15.0)
Lymphocytes Relative: 30.5 % (ref 12.0–46.0)
Lymphs Abs: 1.3 10*3/uL (ref 0.7–4.0)
MCHC: 33.9 g/dL (ref 30.0–36.0)
MCV: 93.4 fl (ref 78.0–100.0)
Monocytes Absolute: 0.3 10*3/uL (ref 0.1–1.0)
Monocytes Relative: 6.7 % (ref 3.0–12.0)
Neutro Abs: 2.5 10*3/uL (ref 1.4–7.7)
Neutrophils Relative %: 58.1 % (ref 43.0–77.0)
Platelets: 267 10*3/uL (ref 150.0–400.0)
RBC: 4.39 Mil/uL (ref 3.87–5.11)
RDW: 12.7 % (ref 11.5–15.5)
WBC: 4.3 10*3/uL (ref 4.0–10.5)

## 2021-01-16 LAB — HEPATIC FUNCTION PANEL
ALT: 22 U/L (ref 0–35)
AST: 26 U/L (ref 0–37)
Albumin: 4.3 g/dL (ref 3.5–5.2)
Alkaline Phosphatase: 78 U/L (ref 39–117)
Bilirubin, Direct: 0.1 mg/dL (ref 0.0–0.3)
Total Bilirubin: 0.8 mg/dL (ref 0.2–1.2)
Total Protein: 6.5 g/dL (ref 6.0–8.3)

## 2021-01-16 LAB — TSH: TSH: 5.17 u[IU]/mL — ABNORMAL HIGH (ref 0.35–4.50)

## 2021-01-16 LAB — LIPID PANEL
Cholesterol: 221 mg/dL — ABNORMAL HIGH (ref 0–200)
HDL: 68.6 mg/dL (ref >39.00)
LDL Cholesterol: 143 mg/dL — ABNORMAL HIGH (ref 0–99)
NonHDL: 151.99
Total CHOL/HDL Ratio: 3
Triglycerides: 47 mg/dL (ref 0.0–149.0)
VLDL: 9.4 mg/dL (ref 0.0–40.0)

## 2021-01-16 LAB — VITAMIN D 25 HYDROXY (VIT D DEFICIENCY, FRACTURES): VITD: 37.48 ng/mL (ref 30.00–100.00)

## 2021-01-16 NOTE — Assessment & Plan Note (Signed)
UTD on DEXA.  Check Vit D level and replete prn. 

## 2021-01-16 NOTE — Patient Instructions (Signed)
Follow up in 1 year or as needed We'll notify you of your lab results and make any changes if needed We'll call you with your GI appt

## 2021-01-16 NOTE — Progress Notes (Signed)
   Subjective:    Patient ID: Molley Harbach, female    DOB: May 04, 1956, 65 y.o.   MRN: [id number]  HPI CPE- UTD on mammo, DEXA.  UTD on COVID- booster was Jan 7th 2022.  UTD on Tdap.  Due for 2nd shingles- pt to call and schedule.  Due for colonoscopy (referred in 2020, was scheduled for fall 2021 but she had shoulder surgery).  Pt declines pap  Reviewed past medical, surgical, family and social histories.   Patient Care Team    Relationship Specialty Notifications Start End  Sheliah Hatch, MD PCP - General   07/09/10   Register, Joice Lofts, PA-C Physician Assistant Dermatology  04/04/19     Health Maintenance  Topic Date Due   Hepatitis C Screening  Never done   Zoster Vaccines- Shingrix (2 of 2) 10/12/2016   COLONOSCOPY (Pts 45-73yrs Insurance coverage will need to be confirmed)  12/11/2018   PAP SMEAR-Modifier  08/18/2019   COVID-19 Vaccine (3 - Booster for Pfizer series) 04/03/2020   HIV Screening  01/16/2022 (Originally 07/[birth year]/1971)   INFLUENZA VACCINE  02/24/2021   MAMMOGRAM  04/09/2021   DEXA SCAN  04/09/2022   TETANUS/TDAP  08/07/2025   Pneumococcal Vaccine 19-20 Years old  Aged Out   HPV VACCINES  Aged Out      Review of Systems Patient reports no vision/ hearing changes, adenopathy,fever, weight change,  persistant/recurrent hoarseness , swallowing issues, chest pain, palpitations, edema, persistant/recurrent cough, hemoptysis, dyspnea (rest/exertional/paroxysmal nocturnal), gastrointestinal bleeding (melena, rectal bleeding), abdominal pain, significant heartburn, bowel changes, GU symptoms (dysuria, hematuria, incontinence), Gyn symptoms (abnormal  bleeding, pain),  syncope, focal weakness, memory loss, numbness & tingling, skin/hair/nail changes, abnormal bruising or bleeding, anxiety, or depression.   This visit occurred during the SARS-CoV-2 public health emergency.  Safety protocols were in place, including screening questions prior to the visit, additional usage of  staff PPE, and extensive cleaning of exam room while observing appropriate contact time as indicated for disinfecting solutions.      Objective:   Physical Exam General Appearance:    Alert, cooperative, no distress, appears stated age  Head:    Normocephalic, without obvious abnormality, atraumatic  Eyes:    PERRL, conjunctiva/corneas clear, EOM's intact, fundi    benign, both eyes  Ears:    Normal TM's and external ear canals, both ears  Nose:   Deferred due to COVID  Throat:   Neck:   Supple, symmetrical, trachea midline, no adenopathy;    Thyroid: no enlargement/tenderness/nodules  Back:     Symmetric, no curvature, ROM normal, no CVA tenderness  Lungs:     Clear to auscultation bilaterally, respirations unlabored  Chest Wall:    No tenderness or deformity   Heart:    Regular rate and rhythm, S1 and S2 normal, no murmur, rub   or gallop  Breast Exam:    Deferred to mammo  Abdomen:     Soft, non-tender, bowel sounds active all four quadrants,    no masses, no organomegaly  Genitalia:    Deferred  Rectal:    Extremities:   Extremities normal, atraumatic, no cyanosis or edema  Pulses:   2+ and symmetric all extremities  Skin:   Skin color, texture, turgor normal, no rashes or lesions  Lymph nodes:   Cervical, supraclavicular, and axillary nodes normal  Neurologic:   CNII-XII intact, normal strength, sensation and reflexes    throughout          Assessment & Plan:

## 2021-01-16 NOTE — Assessment & Plan Note (Signed)
Ongoing issue.  Attempting to control w/ diet and exercise.  Check labs and see if meds needed.

## 2021-01-16 NOTE — Assessment & Plan Note (Signed)
Pt's PE WNL.  UTD on mammo, DEXA, COVID, Tdap.  Due for colonoscopy- referral placed.  Pt no longer needs paps since last pap was normal and had negative HPV.  Check labs.  Anticipatory guidance provided.

## 2021-01-17 ENCOUNTER — Other Ambulatory Visit (INDEPENDENT_AMBULATORY_CARE_PROVIDER_SITE_OTHER): Payer: 59

## 2021-01-17 DIAGNOSIS — R7989 Other specified abnormal findings of blood chemistry: Secondary | ICD-10-CM

## 2021-01-17 LAB — T4, FREE: Free T4: 0.67 ng/dL (ref 0.60–1.60)

## 2021-01-17 LAB — HEPATITIS C ANTIBODY
Hepatitis C Ab: NONREACTIVE
SIGNAL TO CUT-OFF: 0.01 (ref <1.00)

## 2021-01-17 LAB — T3, FREE: T3, Free: 3.1 pg/mL (ref 2.3–4.2)

## 2021-01-22 ENCOUNTER — Encounter: Payer: Self-pay | Admitting: *Deleted

## 2021-01-28 ENCOUNTER — Other Ambulatory Visit: Payer: Self-pay | Admitting: Family Medicine

## 2021-07-30 ENCOUNTER — Other Ambulatory Visit: Payer: Self-pay | Admitting: Family Medicine

## 2021-10-28 ENCOUNTER — Telehealth: Payer: Self-pay

## 2021-10-28 ENCOUNTER — Other Ambulatory Visit: Payer: Self-pay

## 2021-10-28 MED ORDER — BUPROPION HCL ER (XL) 300 MG PO TB24: ORAL_TABLET | ORAL | 0 refills | Status: DC

## 2021-10-28 NOTE — Telephone Encounter (Signed)
Rx sent 

## 2021-10-28 NOTE — Telephone Encounter (Signed)
MEDICATION: buPROPion (WELLBUTRIN XL) 300 MG 24 hr tablet ? ?PHARMACY: [facility] # [place], [place] ? ?Comments:  ? ?**Let patient know to contact pharmacy at the end of the day to make sure medication is ready. ** ? ?** Please notify patient to allow 48-72 hours to process** ? ?**Encourage patient to contact the pharmacy for refills or they can request refills through [facility]** ?  ?

## 2022-01-19 ENCOUNTER — Encounter: Payer: Self-pay | Admitting: Family Medicine

## 2022-01-19 ENCOUNTER — Ambulatory Visit (INDEPENDENT_AMBULATORY_CARE_PROVIDER_SITE_OTHER): Payer: Medicare Other | Admitting: Family Medicine

## 2022-01-19 VITALS — BP 122/74 | HR 64 | Temp 97.9°F | Resp 16 | Ht 63.0 in | Wt 113.0 lb

## 2022-01-19 DIAGNOSIS — Z0000 Encounter for general adult medical examination without abnormal findings: Secondary | ICD-10-CM | POA: Diagnosis not present

## 2022-01-19 DIAGNOSIS — M810 Age-related osteoporosis without current pathological fracture: Secondary | ICD-10-CM | POA: Diagnosis not present

## 2022-01-19 DIAGNOSIS — Z1211 Encounter for screening for malignant neoplasm of colon: Secondary | ICD-10-CM

## 2022-01-19 DIAGNOSIS — E785 Hyperlipidemia, unspecified: Secondary | ICD-10-CM

## 2022-01-19 DIAGNOSIS — Z23 Encounter for immunization: Secondary | ICD-10-CM | POA: Diagnosis not present

## 2022-01-19 DIAGNOSIS — Z1231 Encounter for screening mammogram for malignant neoplasm of breast: Secondary | ICD-10-CM

## 2022-01-19 LAB — CBC WITH DIFFERENTIAL/PLATELET
Basophils Absolute: 0.1 10*3/uL (ref 0.0–0.1)
Basophils Relative: 1.1 % (ref 0.0–3.0)
Eosinophils Absolute: 0.1 10*3/uL (ref 0.0–0.7)
Eosinophils Relative: 2.7 % (ref 0.0–5.0)
HCT: 42.9 % (ref 36.0–46.0)
Hemoglobin: 14.2 g/dL (ref 12.0–15.0)
Lymphocytes Relative: 28.8 % (ref 12.0–46.0)
Lymphs Abs: 1.3 10*3/uL (ref 0.7–4.0)
MCHC: 33.2 g/dL (ref 30.0–36.0)
MCV: 94.6 fl (ref 78.0–100.0)
Monocytes Absolute: 0.4 10*3/uL (ref 0.1–1.0)
Monocytes Relative: 8.2 % (ref 3.0–12.0)
Neutro Abs: 2.7 10*3/uL (ref 1.4–7.7)
Neutrophils Relative %: 59.2 % (ref 43.0–77.0)
Platelets: 251 10*3/uL (ref 150.0–400.0)
RBC: 4.54 Mil/uL (ref 3.87–5.11)
RDW: 13.2 % (ref 11.5–15.5)
WBC: 4.6 10*3/uL (ref 4.0–10.5)

## 2022-01-19 LAB — HEPATIC FUNCTION PANEL
ALT: 25 U/L (ref 0–35)
AST: 28 U/L (ref 0–37)
Albumin: 4.3 g/dL (ref 3.5–5.2)
Alkaline Phosphatase: 75 U/L (ref 39–117)
Bilirubin, Direct: 0.1 mg/dL (ref 0.0–0.3)
Total Bilirubin: 0.6 mg/dL (ref 0.2–1.2)
Total Protein: 7 g/dL (ref 6.0–8.3)

## 2022-01-19 LAB — BASIC METABOLIC PANEL
BUN: 16 mg/dL (ref 6–23)
CO2: 29 mEq/L (ref 19–32)
Calcium: 10 mg/dL (ref 8.4–10.5)
Chloride: 103 mEq/L (ref 96–112)
Creatinine, Ser: 0.95 mg/dL (ref 0.40–1.20)
GFR: 62.87 mL/min (ref >60.00)
Glucose, Bld: 101 mg/dL — ABNORMAL HIGH (ref 70–99)
Potassium: 4 mEq/L (ref 3.5–5.1)
Sodium: 140 mEq/L (ref 135–145)

## 2022-01-19 LAB — LIPID PANEL
Cholesterol: 224 mg/dL — ABNORMAL HIGH (ref 0–200)
HDL: 80 mg/dL (ref >39.00)
LDL Cholesterol: 134 mg/dL — ABNORMAL HIGH (ref 0–99)
NonHDL: 143.99
Total CHOL/HDL Ratio: 3
Triglycerides: 49 mg/dL (ref 0.0–149.0)
VLDL: 9.8 mg/dL (ref 0.0–40.0)

## 2022-01-19 LAB — TSH: TSH: 5.13 u[IU]/mL (ref 0.35–5.50)

## 2022-01-19 LAB — VITAMIN D 25 HYDROXY (VIT D DEFICIENCY, FRACTURES): VITD: 50.82 ng/mL (ref 30.00–100.00)

## 2022-01-19 NOTE — Assessment & Plan Note (Signed)
Pt's PE WNL.  Overdue on colonoscopy and mammo (referrals placed).  Prevnar 20 given today.  Check labs.  Anticipatory guidance provided.

## 2022-01-19 NOTE — Assessment & Plan Note (Signed)
Ongoing issue.  Attempting to control w/ diet and exercise.  Check labs and determine if medication is needed

## 2022-01-19 NOTE — Assessment & Plan Note (Signed)
UTD on DEXA.  Check Vit D and replete prn. 

## 2022-01-20 ENCOUNTER — Telehealth: Payer: Self-pay

## 2022-01-25 ENCOUNTER — Other Ambulatory Visit: Payer: Self-pay | Admitting: Family Medicine

## 2022-02-04 ENCOUNTER — Ambulatory Visit
Admission: RE | Admit: 2022-02-04 | Discharge: 2022-02-04 | Disposition: A | Discharge status: Home / Self Care | Payer: Medicare Other | Source: Ambulatory Visit | Attending: Family Medicine | Admitting: Family Medicine

## 2022-02-04 DIAGNOSIS — Z1231 Encounter for screening mammogram for malignant neoplasm of breast: Secondary | ICD-10-CM

## 2022-04-28 ENCOUNTER — Other Ambulatory Visit: Payer: Self-pay | Admitting: Family Medicine

## 2022-06-03 IMAGING — XA DG FLUORO GUIDE NDL PLC/BX
3 series · 3 of 3 positions shown · non-contrast
Comparison: none

CLINICAL DATA: Right shoulder pain, unspecified chronicity.

[Series 1: ortho adipose · 1 of 1 slices shown (1 of 3)]
[im 1/1]
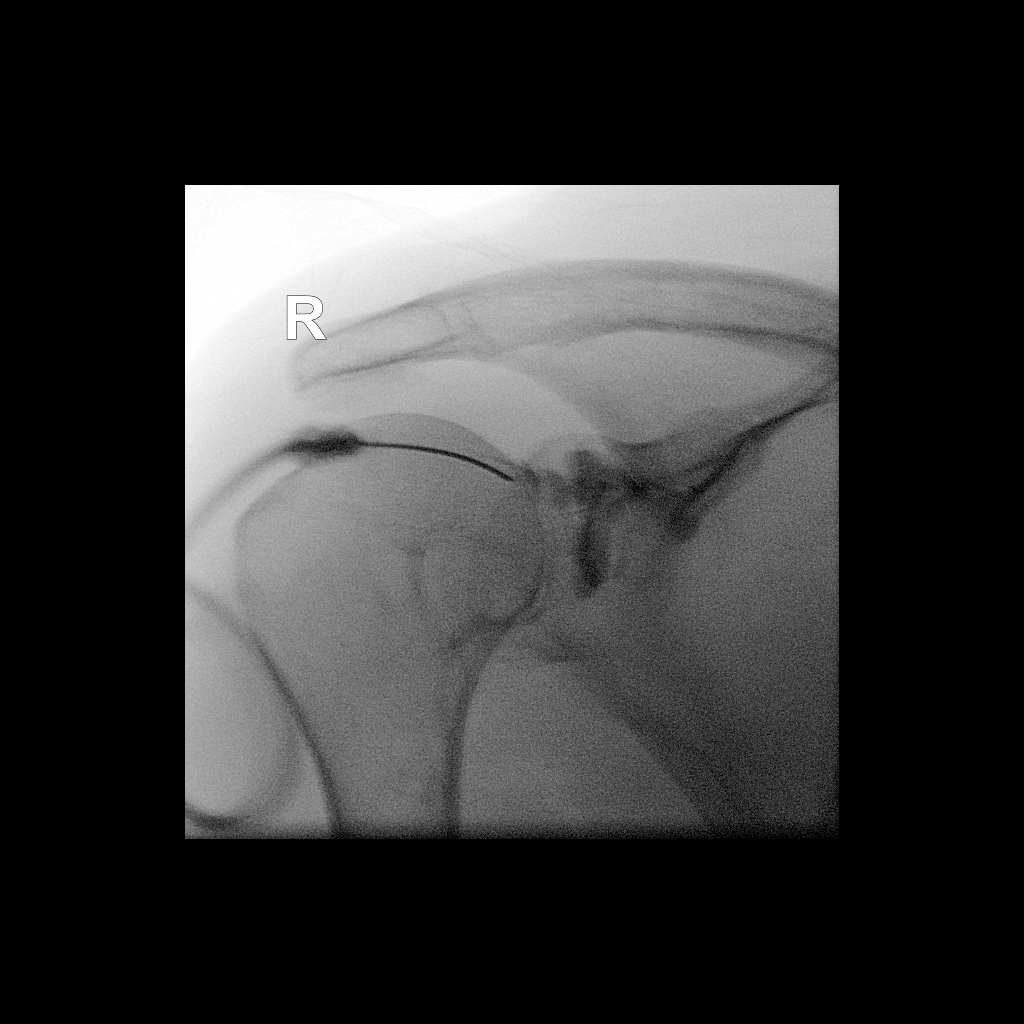

[Series 2: ortho adipose · 1 of 1 slices shown (2 of 3)]
[im 1/1]
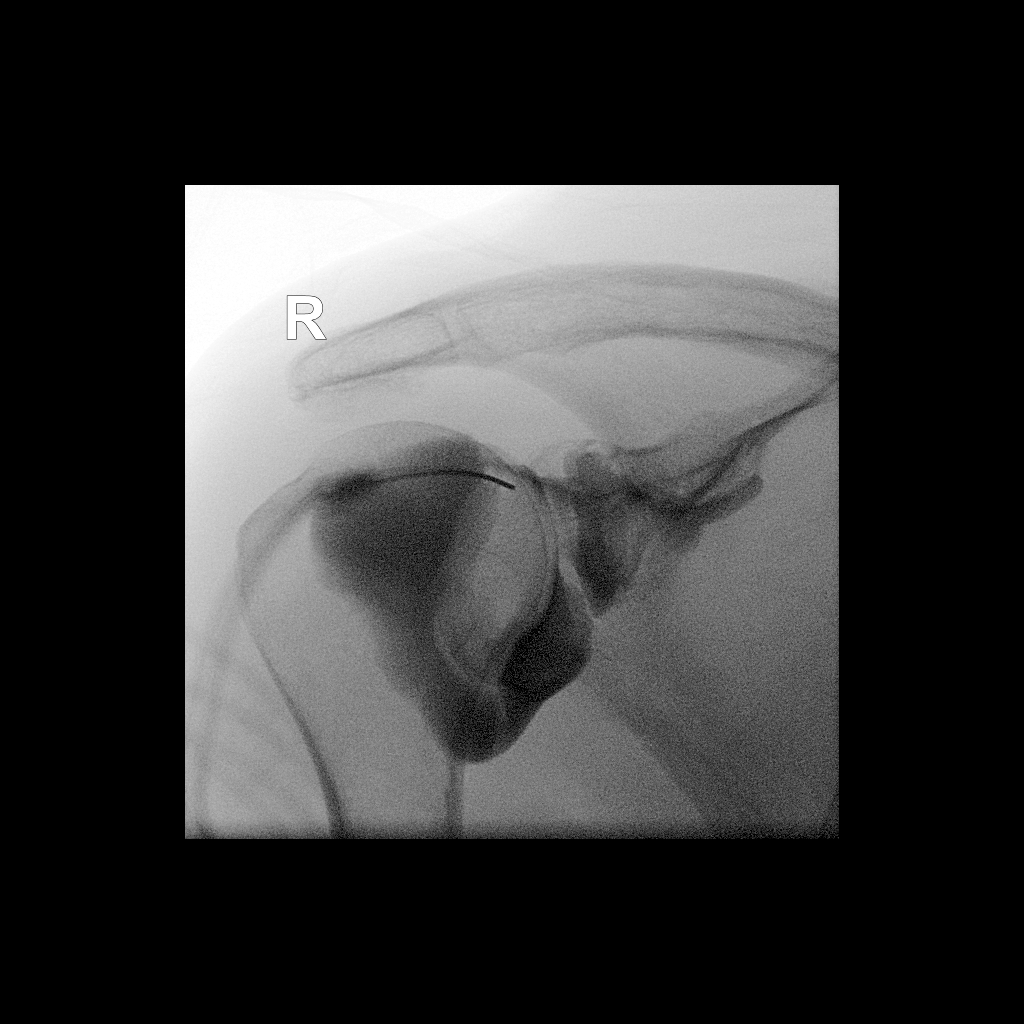

[Series 3: ortho adipose · 1 of 1 slices shown (3 of 3)]
[im 1/1]
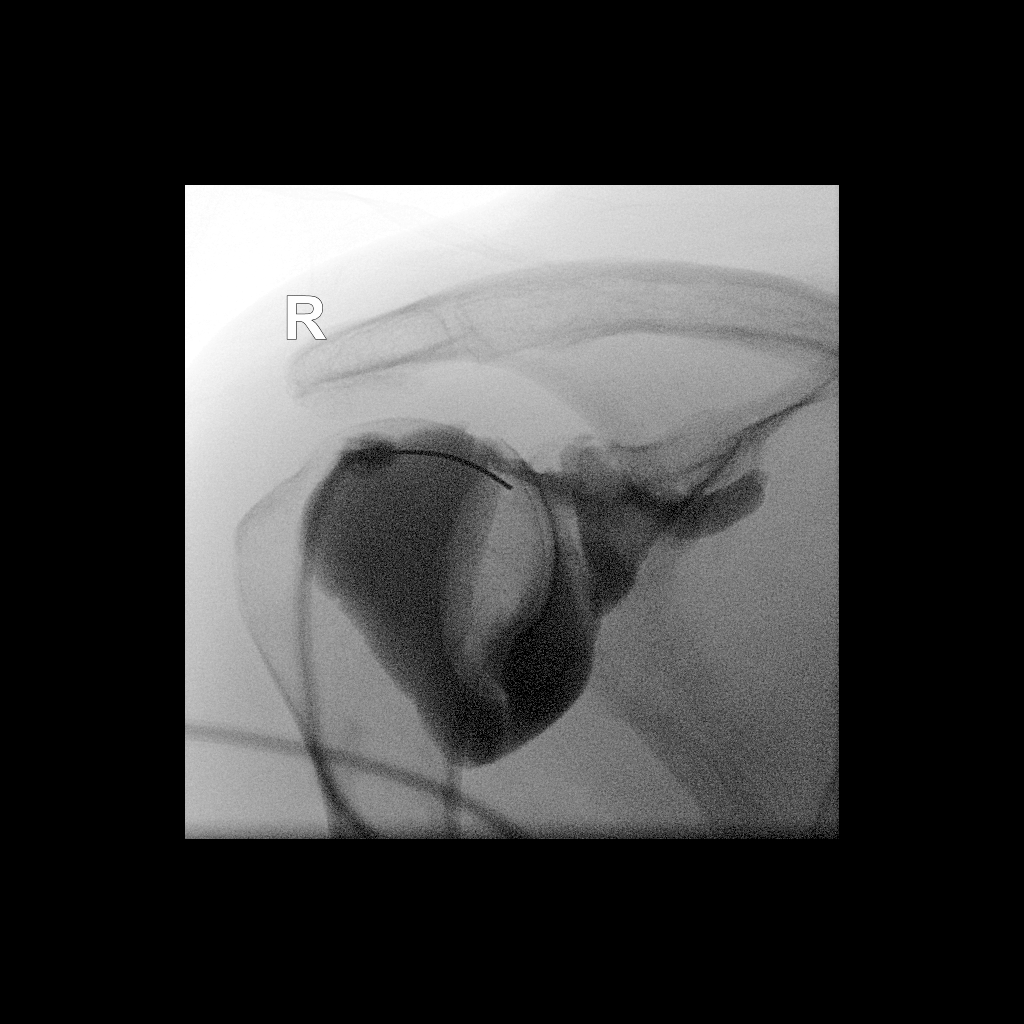

[3 of 3 positions shown; findings below may reference images not displayed]

FLUOROSCOPY TIME:  Radiation Exposure Index (as provided by the
fluoroscopic device): 2.04 uGy*m2

PROCEDURE:
Right SHOULDER INJECTION UNDER FLUOROSCOPY

An appropriate skin entrance site was determined. The site was
marked, prepped with Betadine, draped in the usual sterile fashion,
and infiltrated locally with buffered Lidocaine. 22 gauge spinal
needle was advanced to the superomedial margin of the humeral head
under intermittent fluoroscopy. 1 ml of Lidocaine injected easily. A
mixture of 0.1 ml Multihance and 20 ml of dilute Isovue 200 was then
used to opacify the right shoulder capsule. No immediate
complication.
IMPRESSION: Technically successful right shoulder injection for MRI.

## 2022-06-03 IMAGING — MR MR SHOULDER*R* W/CM
6 series · 40 of 40 positions shown · IV contrast (agent unspecified)
Comparison: Plain films right shoulder 02/28/2020. Images from
contrast injection also reviewed.

CLINICAL DATA: Right shoulder pain for 3-4 months. No known injury.

EXAM:
MR ARTHROGRAM OF THE RIGHT SHOULDER
TECHNIQUE: Multiplanar, multisequence MR imaging of the right shoulder was
performed following the administration of intra-articular contrast.
CONTRAST:  See Injection Documentation.

[Series 3: T1 fat-sat · axial · 4.0mm · 0.27mm/px · z∈[-11,+72]mm · 8 of 18 slices shown (1 of 4)]
[im 1/18]
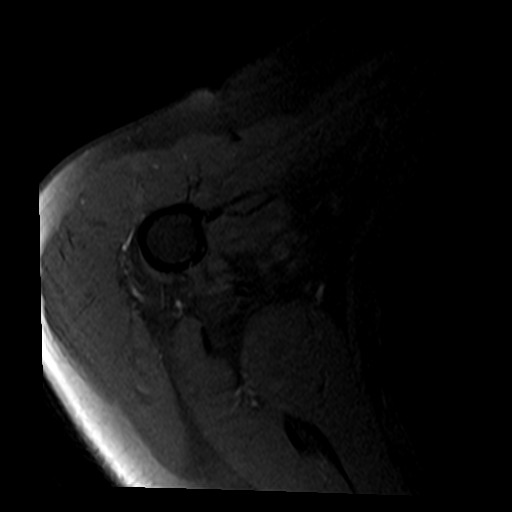
[im 3/18]
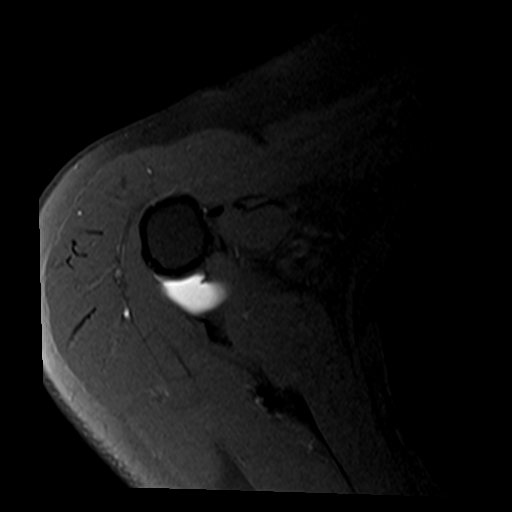
[im 5/18]
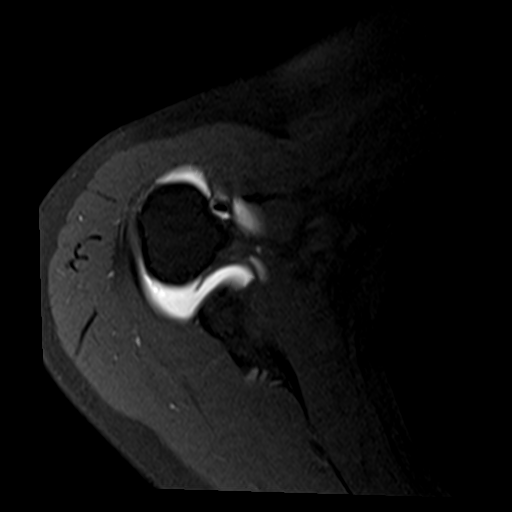
[im 8/18]
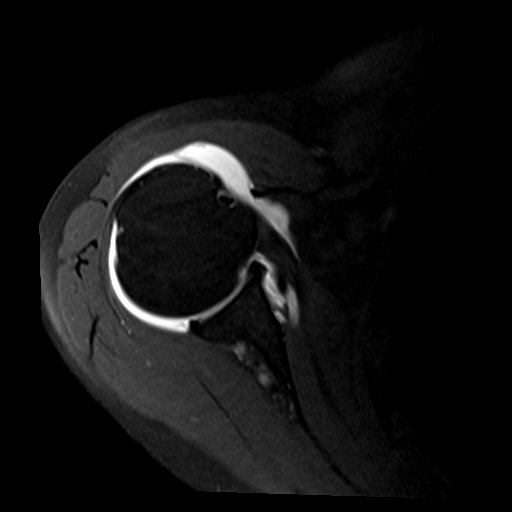
[im 10/18]
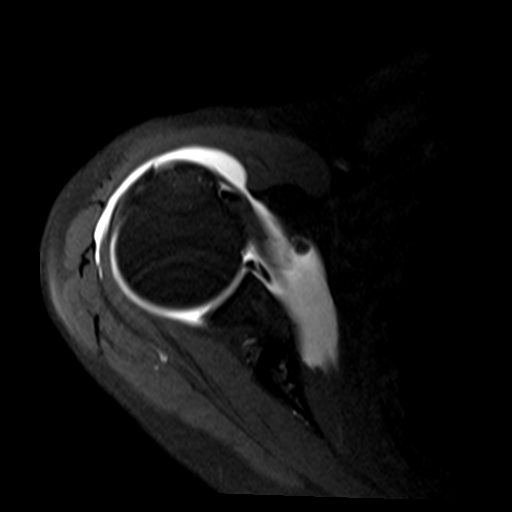
[im 13/18]
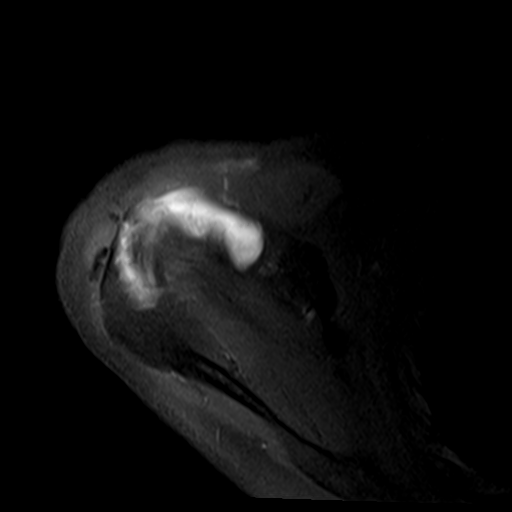
[im 15/18]
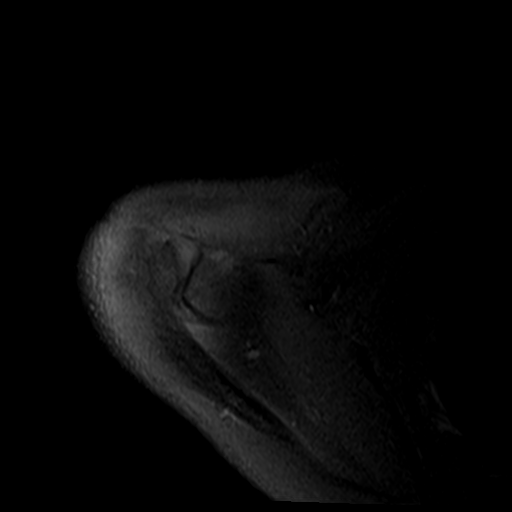
[im 18/18]
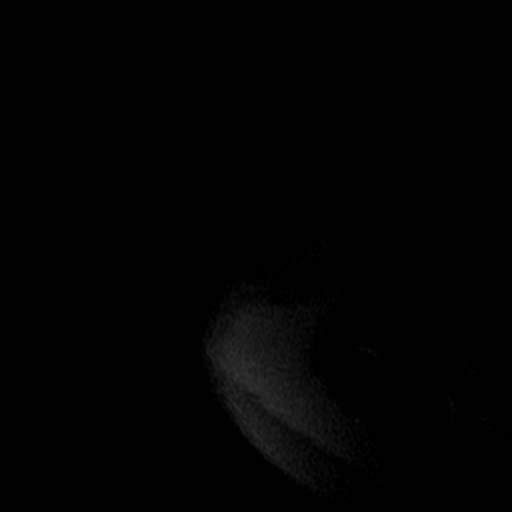

[Series 4: T2 fat-sat · oblique · 4.0mm · 0.55mm/px · 8 of 20 slices shown (1 of 2)]
[im 1/20]
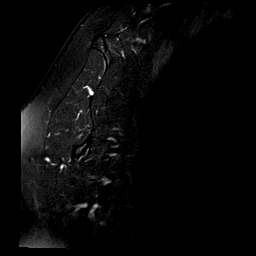
[im 3/20]
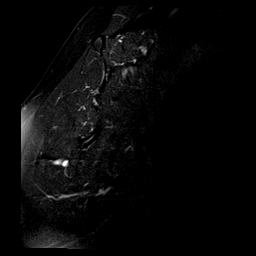
[im 6/20]
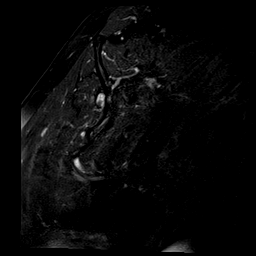
[im 9/20]
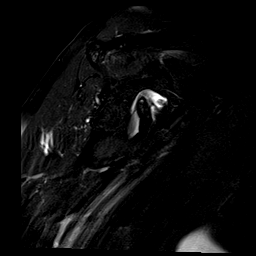
[im 11/20]
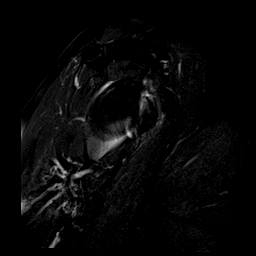
[im 14/20]
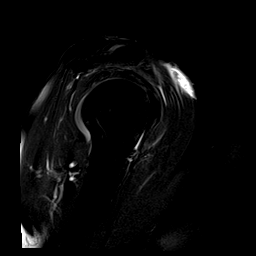
[im 17/20]
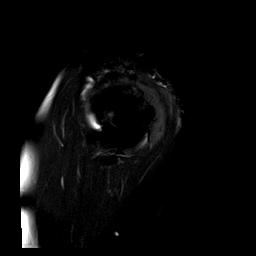
[im 20/20]
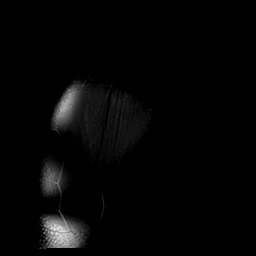

[Series 5: T1 fat-sat · oblique · 4.0mm · 0.55mm/px · 6 of 16 slices shown (2 of 4)]
[im 1/16]
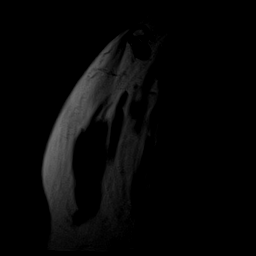
[im 4/16]
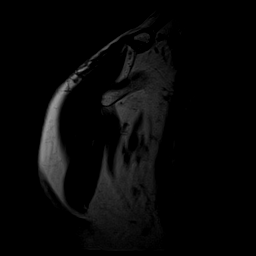
[im 7/16]
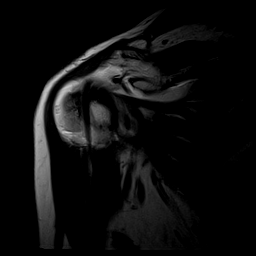
[im 10/16]
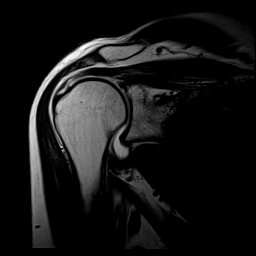
[im 13/16]
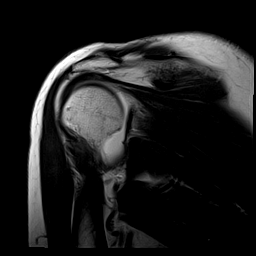
[im 16/16]
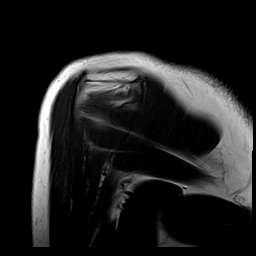

[Series 6: T1 fat-sat · oblique · 4.0mm · 0.55mm/px · 6 of 16 slices shown (3 of 4)]
[im 1/16]
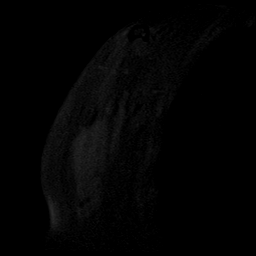
[im 4/16]
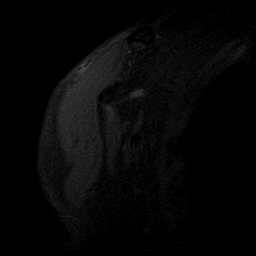
[im 7/16]
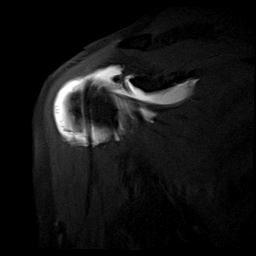
[im 10/16]
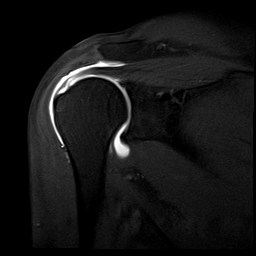
[im 13/16]
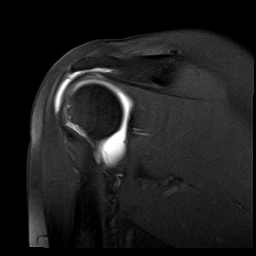
[im 16/16]
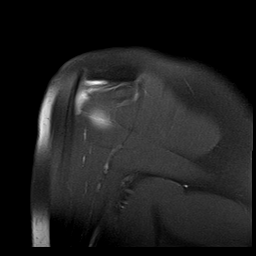

[Series 7: T2 fat-sat · oblique · 4.0mm · 0.55mm/px · 6 of 16 slices shown (2 of 2)]
[im 1/16]
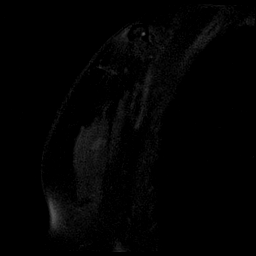
[im 4/16]
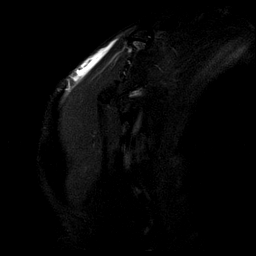
[im 7/16]
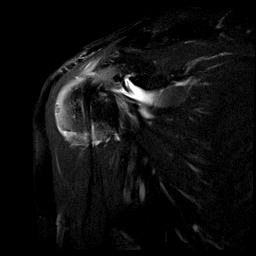
[im 10/16]
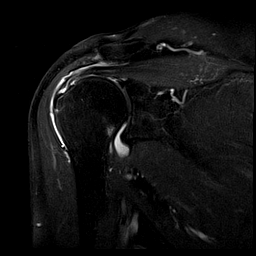
[im 13/16]
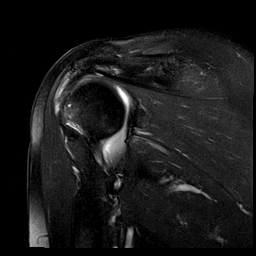
[im 16/16]
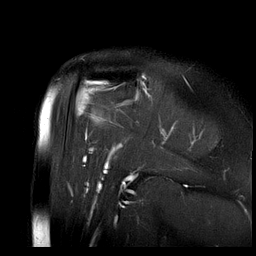

[Series 10: T1 fat-sat · sagittal · 4.0mm · 0.59mm/px · 6 of 16 slices shown (4 of 4)]
[im 1/16]
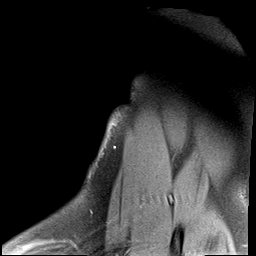
[im 4/16]
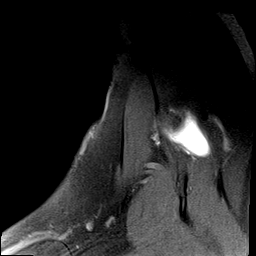
[im 7/16]
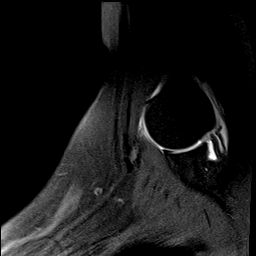
[im 10/16]
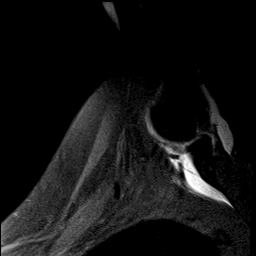
[im 13/16]
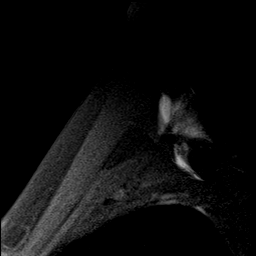
[im 16/16]
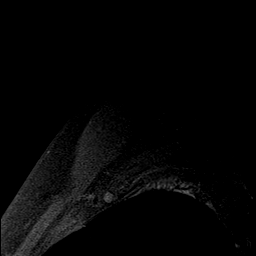

[40 of 40 positions shown; findings below may reference images not displayed]

FINDINGS: Rotator cuff: The patient has rotator cuff tendinopathy with a
full-thickness and near full width tear of the supraspinatus
measuring approximately 1.5 cm from front to back. Retraction is
1.5-2.5 cm. The rotator cuff is otherwise intact.

Muscles: Normal without atrophy or focal lesion.

Biceps long head: Intact.

Acromioclavicular Joint: Mild osteoarthritis. Type 2 acromion. The
patient has a pre-acromion type os acromiale and there is mild
subacromial spurring.

Glenohumeral Joint: Appears normal.

Labrum: Intact.

Bones: No fracture, contusion or worrisome lesion.
IMPRESSION: Rotator cuff tendinopathy with a full-thickness supraspinatus tear
measuring approximately 1.5 cm from front to back. Retraction is
1.5-2.5 cm. No atrophy.

Intact long head of biceps and glenoid labrum.

Mild acromioclavicular osteoarthritis. Type 2 acromion and
pre-acromion type os acromiale noted.

## 2022-07-26 ENCOUNTER — Other Ambulatory Visit: Payer: Self-pay | Admitting: Family Medicine

## 2022-10-24 ENCOUNTER — Other Ambulatory Visit: Payer: Self-pay | Admitting: Family Medicine

## 2023-01-23 ENCOUNTER — Other Ambulatory Visit: Payer: Self-pay | Admitting: Family Medicine

## 2023-01-25 ENCOUNTER — Other Ambulatory Visit: Payer: Self-pay

## 2023-01-25 MED ORDER — BUPROPION HCL ER (XL) 300 MG PO TB24: 300.0000 mg | ORAL_TABLET | Freq: Every day | ORAL | 0 refills | Status: DC

## 2023-01-26 ENCOUNTER — Telehealth: Payer: Self-pay | Admitting: Family Medicine

## 2023-01-27 ENCOUNTER — Ambulatory Visit (INDEPENDENT_AMBULATORY_CARE_PROVIDER_SITE_OTHER): Payer: Medicare Other | Admitting: *Deleted

## 2023-01-27 DIAGNOSIS — Z1231 Encounter for screening mammogram for malignant neoplasm of breast: Secondary | ICD-10-CM

## 2023-01-27 DIAGNOSIS — Z780 Asymptomatic menopausal state: Secondary | ICD-10-CM

## 2023-01-27 DIAGNOSIS — Z0000 Encounter for general adult medical examination without abnormal findings: Secondary | ICD-10-CM | POA: Diagnosis not present

## 2023-01-27 NOTE — Progress Notes (Signed)
Subjective:   Alexa Proctor is a 67 y.o. female who presents for Medicare Annual (Subsequent) preventive examination.  Visit Complete: Virtual  I connected with  [name] on 01/27/23 by a audio enabled telemedicine application and verified that I am speaking with the correct person using two identifiers.  Patient Location: Home  Provider Location: Home Office  I discussed the limitations of evaluation and management by telemedicine. The patient expressed understanding and agreed to proceed.  Patient Medicare AWV questionnaire was completed by the patient on 01-2023; I have confirmed that all information answered by patient is correct and no changes since this date.  Review of Systems     Cardiac Risk Factors include: advanced age (>46men, >22 women)     Objective:    Today's Vitals   There is no height or weight on file to calculate BMI.     01/27/2023    3:04 PM 04/25/2020   10:19 AM 04/18/2020   10:39 AM  Advanced Directives  Does Patient Have a Medical Advance Directive? Yes Yes Yes  Type of Estate agent of [employer] Power of [place];Living will Healthcare Power of [place];Living will  Copy of Healthcare Power of Attorney in Chart? No - copy requested No - copy requested     Current Medications (verified) Outpatient Encounter Medications as of 01/27/2023  Medication Sig   buPROPion (WELLBUTRIN XL) 300 MG 24 hr tablet Take 1 tablet (300 mg total) by mouth daily.   cetirizine (ZYRTEC) 10 MG tablet Take 10 mg by mouth daily.   Multiple Vitamin (MULTIVITAMIN) tablet Take 1 tablet by mouth daily.   NON FORMULARY CBD oil   No facility-administered encounter medications on file as of 01/27/2023.    Allergies (verified) Soma [carisoprodol] and Sulfonamide derivatives   History: Past Medical History:  Diagnosis Date   Depression    Groin mass in female    Osteoporosis    Past Surgical History:  Procedure Laterality Date   BREAST  SURGERY  03/1992   implants   HERNIA REPAIR     right groin mass  03/24/12   SHOULDER ARTHROSCOPY WITH ROTATOR CUFF REPAIR AND OPEN BICEPS TENODESIS Right 04/25/2020   Procedure: RIGHT SHOULDER ARTHROSCOPY, DEBRIDEMENT, MINI OPEN ROTATOR CUFF TEAR REPAIR AND OPEN BICEPS TENODESIS;  Surgeon: Cammy Copa, MD;  Location: [facility];  Service: Orthopedics;  Laterality: Right;   TUBAL LIGATION  08/1990   Family History  Problem Relation Age of Onset   Cancer Mother        breast   Hypertension Father    Cancer Father        pancreatic   Cancer Sister        colon, lung   Breast cancer Neg Hx    Social History   Socioeconomic History   Marital status: Married    Spouse name: Not on file   Number of children: Not on file   Years of education: Not on file   Highest education level: Not on file  Occupational History   Not on file  Tobacco Use   Smoking status: Former    Types: Cigarettes   Smokeless tobacco: Never  Vaping Use   Vaping Use: Every day   Start date: 12/26/2018  Substance and Sexual Activity   Alcohol use: Not Currently   Drug use: No   Sexual activity: Not Currently  Other Topics Concern   Not on file  Social History Narrative   Not on file  Social Determinants of Health   Financial Resource Strain: Low Risk  (01/27/2023)   Overall Financial Resource Strain (CARDIA)    Difficulty of Paying Living Expenses: Not hard at all  Food Insecurity: No Food Insecurity (01/27/2023)   Hunger Vital Sign    Worried About Running Out of Food in the Last Year: Never true    Ran Out of Food in the Last Year: Never true  Transportation Needs: No Transportation Needs (01/27/2023)   PRAPARE - Administrator, Civil Service (Medical): No    Lack of Transportation (Non-Medical): No  Physical Activity: Inactive (01/27/2023)   Exercise Vital Sign    Days of Exercise per Week: 0 days    Minutes of Exercise per Session: 0 min  Stress: No Stress Concern  Present (01/27/2023)   [employer] of Occupational Health - Occupational Stress Questionnaire    Feeling of Stress : Not at all  Social Connections: Moderately Isolated (01/27/2023)   Social Connection and Isolation Panel [NHANES]    Frequency of Communication with Friends and Family: More than three times a week    Frequency of Social Gatherings with Friends and Family: Once a week    Attends Religious Services: Never    Database administrator or Organizations: No    Attends Engineer, structural: Never    Marital Status: Married    Tobacco Counseling Counseling given: Not Answered   Clinical Intake:  Pre-visit preparation completed: Yes  Pain : No/denies pain     Diabetes: No  How often do you need to have someone help you when you read instructions, pamphlets, or other written materials from your doctor or pharmacy?: 1 - Never  Interpreter Needed?: No  Information entered by :: Remi Haggard LPN   Activities of Daily Living    01/27/2023    3:02 PM 01/26/2023    3:57 PM  In your present state of health, do you have any difficulty performing the following activities:  Hearing? 0 0  Vision? 0 0  Difficulty concentrating or making decisions? 0 0  Walking or climbing stairs? 0 0  Dressing or bathing? 0 0  Doing errands, shopping? 0 0  Preparing Food and eating ? N N  Using the Toilet? N N  In the past six months, have you accidently leaked urine? N N  Do you have problems with loss of bowel control? N N  Managing your Medications? N N  Managing your Finances? N N  Housekeeping or managing your Housekeeping? N N    Patient Care Team: Sheliah Hatch, MD as PCP - General Register, Joice Lofts, PA-C as Advice worker (Dermatology)  Indicate any recent Medical Services you may have received from other than [facility] providers in the past year (date may be approximate).     Assessment:   This is a routine wellness examination for Alexa Proctor.  Hearing/Vision  screen Hearing Screening - Comments:: No trouble hearing  Vision Screening - Comments:: [place] Up to date  Dietary issues and exercise activities discussed:     Goals Addressed   None   Depression Screen    01/27/2023    3:08 PM 01/19/2022   10:59 AM 01/16/2021   10:01 AM 01/12/2020    1:29 PM 01/11/2019    1:03 PM 08/31/2018    3:03 PM 10/28/2017    1:23 PM  PHQ 2/9 Scores  PHQ - 2 Score 1 0 0 0 0 0 1  PHQ- 9 Score 2 0 0  0 0 0 1    Fall Risk    01/27/2023    3:03 PM 01/26/2023    3:57 PM 01/19/2022   10:59 AM 01/16/2021   10:00 AM 01/12/2020    1:29 PM  Fall Risk   Falls in the past year? 0 0 0 0 0  Number falls in past yr: 0 0 0 0 0  Injury with Fall? 0 0 0 0 0  Risk for fall due to :   No Fall Risks No Fall Risks   Follow up Falls evaluation completed;Education provided;Falls prevention discussed  Falls evaluation completed  Falls evaluation completed    MEDICARE RISK AT HOME:   TIMED UP AND GO:  Was the test performed?  No    Cognitive Function:        01/27/2023    3:06 PM  6CIT Screen  What Year? 0 points  What month? 0 points  What time? 0 points  Count back from 20 0 points  Months in reverse 0 points  Repeat phrase 0 points  Total Score 0 points    Immunizations Immunization History  Administered Date(s) Administered   Influenza,inj,Quad PF,6+ Mos 05/04/2014, 08/08/2015   Influenza-Unspecified 07/03/2021   PFIZER(Purple Top)SARS-COV-2 Vaccination 10/12/2019, 11/02/2019, 08/02/2020   PNEUMOCOCCAL CONJUGATE-20 01/19/2022   PPD Test 11/30/2011   Tdap 08/08/2015   Zoster Recombinant(Shingrix) 08/17/2016   Zoster, Live 08/17/2016    TDAP status: Up to date  Flu Vaccine status: Up to date  Pneumococcal vaccine status: Up to date  Covid-19 vaccine status: Information provided on how to obtain vaccines.   Qualifies for Shingles Vaccine? Yes   Zostavax completed No   Shingrix Completed?: No.    Education has been provided regarding the importance of  this vaccine. Patient has been advised to call insurance company to determine out of pocket expense if they have not yet received this vaccine. Advised may also receive vaccine at local pharmacy or Health Dept. Verbalized acceptance and understanding.  Screening Tests Health Maintenance  Topic Date Due   Colonoscopy  12/11/2018   DEXA SCAN  04/09/2022   MAMMOGRAM  02/05/2023   INFLUENZA VACCINE  02/25/2023   Medicare Annual Wellness (AWV)  01/27/2024   DTaP/Tdap/Td (2 - Td or Tdap) 08/07/2025   Pneumonia Vaccine 61+ Years old  Completed   Hepatitis C Screening  Completed   HPV VACCINES  Aged Out   COVID-19 Vaccine  Discontinued   Zoster Vaccines- Shingrix  Discontinued    Health Maintenance  Health Maintenance Due  Topic Date Due   Colonoscopy  12/11/2018   DEXA SCAN  04/09/2022    Colonoscopy will call schedule  had an appointment cancelled due to surgery  Mammogram status: Ordered  . Pt provided with contact info and advised to call to schedule appt.   Bone Density status: Ordered  . Pt provided with contact info and advised to call to schedule appt.  Lung Cancer Screening: (Low Dose CT Chest recommended if Age 35-80 years, 20 pack-year currently smoking OR have quit w/in 15years.) does not qualify.   Lung Cancer Screening Referral:   Additional Screening:  Hepatitis C Screening: does not qualify; Completed 2022  Vision Screening: Recommended annual ophthalmology exams for early detection of glaucoma and other disorders of the eye. Is the patient up to date with their annual eye exam?  Yes  Who is the provider or what is the name of the office in which the patient attends annual eye exams? [place] If  pt is not established with a provider, would they like to be referred to a provider to establish care? No .   Dental Screening: Recommended annual dental exams for proper oral hygiene   Community Resource Referral / Chronic Care Management: CRR required this visit?  No    CCM required this visit?  No     Plan:     I have personally reviewed and noted the following in the patient's chart:   Medical and social history Use of alcohol, tobacco or illicit drugs  Current medications and supplements including opioid prescriptions. Patient is not currently taking opioid prescriptions. Functional ability and status Nutritional status Physical activity Advanced directives List of other physicians Hospitalizations, surgeries, and ER visits in previous 12 months Vitals Screenings to include cognitive, depression, and falls Referrals and appointments  In addition, I have reviewed and discussed with patient certain preventive protocols, quality metrics, and best practice recommendations. A written personalized care plan for preventive services as well as general preventive health recommendations were provided to patient.     Remi Haggard, LPN   08/01/1094   After Visit Summary: (MyChart) Due to this being a telephonic visit, the after visit summary with patients personalized plan was offered to patient via MyChart   Nurse Notes:

## 2023-01-27 NOTE — Patient Instructions (Signed)
Alexa Proctor , Thank you for taking time to come for your Medicare Wellness Visit. I appreciate your ongoing commitment to your health goals. Please review the following plan we discussed and let me know if I can assist you in the future.   Screening recommendations/referrals: Colonoscopy: Education provided Mammogram: Education provided Bone Density: Education provided Recommended yearly ophthalmology/optometry visit for glaucoma screening and checkup Recommended yearly dental visit for hygiene and checkup  Vaccinations: Influenza vaccine: up to date Pneumococcal vaccine: up to date Tdap vaccine: up to date Shingles vaccine: 1 of 2  Education provided    Advanced directives: yes not on file     Preventive Care 65 Years and Older, Female Preventive care refers to lifestyle choices and visits with your health care provider that can promote health and wellness. What does preventive care include? A yearly physical exam. This is also called an annual well check. Dental exams once or twice a year. Routine eye exams. Ask your health care provider how often you should have your eyes checked. Personal lifestyle choices, including: Daily care of your teeth and gums. Regular physical activity. Eating a healthy diet. Avoiding tobacco and drug use. Limiting alcohol use. Practicing safe sex. Taking low-dose aspirin every day. Taking vitamin and mineral supplements as recommended by your health care provider. What happens during an annual well check? The services and screenings done by your health care provider during your annual well check will depend on your age, overall health, lifestyle risk factors, and family history of disease. Counseling  Your health care provider may ask you questions about your: Alcohol use. Tobacco use. Drug use. Emotional well-being. Home and relationship well-being. Sexual activity. Eating habits. History of falls. Memory and ability to understand  (cognition). Work and work Astronomer. Reproductive health. Screening  You may have the following tests or measurements: Height, weight, and BMI. Blood pressure. Lipid and cholesterol levels. These may be checked every 5 years, or more frequently if you are over 37 years old. Skin check. Lung cancer screening. You may have this screening every year starting at age 63 if you have a 30-pack-year history of smoking and currently smoke or have quit within the past 15 years. Fecal occult blood test (FOBT) of the stool. You may have this test every year starting at age 9. Flexible sigmoidoscopy or colonoscopy. You may have a sigmoidoscopy every 5 years or a colonoscopy every 10 years starting at age 12. Hepatitis C blood test. Hepatitis B blood test. Sexually transmitted disease (STD) testing. Diabetes screening. This is done by checking your blood sugar (glucose) after you have not eaten for a while (fasting). You may have this done every 1-3 years. Bone density scan. This is done to screen for osteoporosis. You may have this done starting at age 34. Mammogram. This may be done every 1-2 years. Talk to your health care provider about how often you should have regular mammograms. Talk with your health care provider about your test results, treatment options, and if necessary, the need for more tests. Vaccines  Your health care provider may recommend certain vaccines, such as: Influenza vaccine. This is recommended every year. Tetanus, diphtheria, and acellular pertussis (Tdap, Td) vaccine. You may need a Td booster every 10 years. Zoster vaccine. You may need this after age 40. Pneumococcal 13-valent conjugate (PCV13) vaccine. One dose is recommended after age 92. Pneumococcal polysaccharide (PPSV23) vaccine. One dose is recommended after age 14. Talk to your health care provider about which screenings and vaccines you  need and how often you need them. This information is not intended to  replace advice given to you by your health care provider. Make sure you discuss any questions you have with your health care provider. Document Released: 08/09/2015 Document Revised: 04/01/2016 Document Reviewed: 05/14/2015 Elsevier Interactive Patient Education  2017 [place] Prevention in the Home Falls can cause injuries. They can happen to people of all ages. There are many things you can do to make your home safe and to help prevent falls. What can I do on the outside of my home? Regularly fix the edges of walkways and driveways and fix any cracks. Remove anything that might make you trip as you walk through a door, such as a raised step or threshold. Trim any bushes or trees on the path to your home. Use bright outdoor lighting. Clear any walking paths of anything that might make someone trip, such as rocks or tools. Regularly check to see if handrails are loose or broken. Make sure that both sides of any steps have handrails. Any raised decks and porches should have guardrails on the edges. Have any leaves, snow, or ice cleared regularly. Use sand or salt on walking paths during winter. Clean up any spills in your garage right away. This includes oil or grease spills. What can I do in the bathroom? Use night lights. Install grab bars by the toilet and in the tub and shower. Do not use towel bars as grab bars. Use non-skid mats or decals in the tub or shower. If you need to sit down in the shower, use a plastic, non-slip stool. Keep the floor dry. Clean up any water that spills on the floor as soon as it happens. Remove soap buildup in the tub or shower regularly. Attach bath mats securely with double-sided non-slip rug tape. Do not have throw rugs and other things on the floor that can make you trip. What can I do in the bedroom? Use night lights. Make sure that you have a light by your bed that is easy to reach. Do not use any sheets or blankets that are too big for  your bed. They should not hang down onto the floor. Have a firm chair that has side arms. You can use this for support while you get dressed. Do not have throw rugs and other things on the floor that can make you trip. What can I do in the kitchen? Clean up any spills right away. Avoid walking on wet floors. Keep items that you use a lot in easy-to-reach places. If you need to reach something above you, use a strong step stool that has a grab bar. Keep electrical cords out of the way. Do not use floor polish or wax that makes floors slippery. If you must use wax, use non-skid floor wax. Do not have throw rugs and other things on the floor that can make you trip. What can I do with my stairs? Do not leave any items on the stairs. Make sure that there are handrails on both sides of the stairs and use them. Fix handrails that are broken or loose. Make sure that handrails are as long as the stairways. Check any carpeting to make sure that it is firmly attached to the stairs. Fix any carpet that is loose or worn. Avoid having throw rugs at the top or bottom of the stairs. If you do have throw rugs, attach them to the floor with carpet tape. Make sure that  you have a light switch at the top of the stairs and the bottom of the stairs. If you do not have them, ask someone to add them for you. What else can I do to help prevent falls? Wear shoes that: Do not have high heels. Have rubber bottoms. Are comfortable and fit you well. Are closed at the toe. Do not wear sandals. If you use a stepladder: Make sure that it is fully opened. Do not climb a closed stepladder. Make sure that both sides of the stepladder are locked into place. Ask someone to hold it for you, if possible. Clearly mark and make sure that you can see: Any grab bars or handrails. First and last steps. Where the edge of each step is. Use tools that help you move around (mobility aids) if they are needed. These  include: Canes. Walkers. Scooters. Crutches. Turn on the lights when you go into a dark area. Replace any light bulbs as soon as they burn out. Set up your furniture so you have a clear path. Avoid moving your furniture around. If any of your floors are uneven, fix them. If there are any pets around you, be aware of where they are. Review your medicines with your doctor. Some medicines can make you feel dizzy. This can increase your chance of falling. Ask your doctor what other things that you can do to help prevent falls. This information is not intended to replace advice given to you by your health care provider. Make sure you discuss any questions you have with your health care provider. Document Released: 05/09/2009 Document Revised: 12/19/2015 Document Reviewed: 08/17/2014 Elsevier Interactive Patient Education  2017 [employer].

## 2023-01-29 ENCOUNTER — Other Ambulatory Visit: Payer: Self-pay

## 2023-01-29 ENCOUNTER — Encounter: Payer: Self-pay | Admitting: Family Medicine

## 2023-01-29 MED ORDER — BUPROPION HCL ER (XL) 300 MG PO TB24: 300.0000 mg | ORAL_TABLET | Freq: Every day | ORAL | 0 refills | Status: DC

## 2023-02-05 ENCOUNTER — Ambulatory Visit (INDEPENDENT_AMBULATORY_CARE_PROVIDER_SITE_OTHER): Payer: Medicare Other | Admitting: Family Medicine

## 2023-02-05 ENCOUNTER — Encounter: Payer: Self-pay | Admitting: Family Medicine

## 2023-02-05 VITALS — BP 102/68 | HR 71 | Temp 98.0°F | Resp 18 | Ht 63.0 in | Wt 114.0 lb

## 2023-02-05 DIAGNOSIS — E785 Hyperlipidemia, unspecified: Secondary | ICD-10-CM

## 2023-02-05 DIAGNOSIS — M810 Age-related osteoporosis without current pathological fracture: Secondary | ICD-10-CM

## 2023-02-05 DIAGNOSIS — F419 Anxiety disorder, unspecified: Secondary | ICD-10-CM

## 2023-02-05 DIAGNOSIS — F32A Depression, unspecified: Secondary | ICD-10-CM

## 2023-02-05 MED ORDER — BUPROPION HCL ER (XL) 300 MG PO TB24
300.0000 mg | ORAL_TABLET | Freq: Every day | ORAL | 1 refills | Status: AC
Start: 2023-02-05 — End: ?
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
## 2023-02-05 NOTE — Patient Instructions (Addendum)
Follow up in 1 year or as needed We'll notify you of your lab results and make any changes if needed Keep up the good work!  You look great! Schedule your mammogram at your convenience Schedule your colonoscopy Call with any questions or concerns Stay Safe!  Stay Healthy! Have a great summer!!!

## 2023-02-05 NOTE — Assessment & Plan Note (Signed)
Ongoing issue.  UTD on DEXA.  Not currently on medication.  Check Vit D and replete prn.

## 2023-02-05 NOTE — Assessment & Plan Note (Signed)
Ongoing issue for pt.  Attempting to control w/ diet and exercise.  Last LDL 122.  Currently asymptomatic.  Check labs and determine if medication is needed

## 2023-02-06 LAB — CBC WITH DIFFERENTIAL/PLATELET
Absolute Monocytes: 435 cells/uL (ref 200–950)
Basophils Absolute: 70 cells/uL (ref 0–200)
Basophils Relative: 1.4 %
Eosinophils Absolute: 140 cells/uL (ref 15–500)
Eosinophils Relative: 2.8 %
HCT: 44.6 % (ref 35.0–45.0)
Hemoglobin: 14.8 g/dL (ref 11.7–15.5)
Lymphs Abs: 1635 cells/uL (ref 850–3900)
MCH: 30.8 pg (ref 27.0–33.0)
MCHC: 33.2 g/dL (ref 32.0–36.0)
MCV: 92.9 fL (ref 80.0–100.0)
MPV: 10.5 fL (ref 7.5–12.5)
Monocytes Relative: 8.7 %
Neutro Abs: 2720 cells/uL (ref 1500–7800)
Neutrophils Relative %: 54.4 %
Platelets: 292 10*3/uL (ref 140–400)
RBC: 4.8 10*6/uL (ref 3.80–5.10)
RDW: 11.9 % (ref 11.0–15.0)
Total Lymphocyte: 32.7 %
WBC: 5 10*3/uL (ref 3.8–10.8)

## 2023-02-06 LAB — HEPATIC FUNCTION PANEL
AG Ratio: 1.9 (calc) (ref 1.0–2.5)
ALT: 23 U/L (ref 6–29)
AST: 28 U/L (ref 10–35)
Albumin: 4.5 g/dL (ref 3.6–5.1)
Alkaline phosphatase (APISO): 79 U/L (ref 37–153)
Bilirubin, Direct: 0.1 mg/dL (ref 0.0–0.2)
Globulin: 2.4 g/dL (calc) (ref 1.9–3.7)
Indirect Bilirubin: 0.6 mg/dL (calc) (ref 0.2–1.2)
Total Bilirubin: 0.7 mg/dL (ref 0.2–1.2)
Total Protein: 6.9 g/dL (ref 6.1–8.1)

## 2023-02-06 LAB — LIPID PANEL
Cholesterol: 232 mg/dL — ABNORMAL HIGH (ref <200)
HDL: 86 mg/dL (ref >=50)
LDL Cholesterol (Calc): 131 mg/dL (calc) — ABNORMAL HIGH
Non-HDL Cholesterol (Calc): 146 mg/dL (calc) — ABNORMAL HIGH (ref <130)
Total CHOL/HDL Ratio: 2.7 (calc) (ref <5.0)
Triglycerides: 49 mg/dL (ref <150)

## 2023-02-06 LAB — BASIC METABOLIC PANEL
BUN: 18 mg/dL (ref 7–25)
CO2: 29 mmol/L (ref 20–32)
Calcium: 10.3 mg/dL (ref 8.6–10.4)
Chloride: 103 mmol/L (ref 98–110)
Creat: 1.03 mg/dL (ref 0.50–1.05)
Glucose, Bld: 94 mg/dL (ref 65–99)
Potassium: 5 mmol/L (ref 3.5–5.3)
Sodium: 140 mmol/L (ref 135–146)

## 2023-02-06 LAB — VITAMIN D 25 HYDROXY (VIT D DEFICIENCY, FRACTURES): Vit D, 25-Hydroxy: 51 ng/mL (ref 30–100)

## 2023-02-06 LAB — TSH: TSH: 4.19 mIU/L (ref 0.40–4.50)

## 2023-02-08 ENCOUNTER — Telehealth: Payer: Self-pay

## 2023-02-08 NOTE — Telephone Encounter (Signed)
-----   Message from Neena Rhymes sent at 02/08/2023  7:25 AM EDT ----- Labs are stable and look good!  No changes at this time

## 2023-02-08 NOTE — Telephone Encounter (Signed)
Left lab results on pt VM 

## 2023-04-26 ENCOUNTER — Other Ambulatory Visit: Payer: Self-pay | Admitting: Family Medicine

## 2023-07-22 NOTE — Telephone Encounter (Signed)
error 

## 2023-08-27 ENCOUNTER — Encounter: Payer: Self-pay | Admitting: Plastic Surgery

## 2023-08-27 ENCOUNTER — Ambulatory Visit: Payer: Medicare Other | Admitting: Plastic Surgery

## 2023-08-27 ENCOUNTER — Other Ambulatory Visit: Payer: Self-pay | Admitting: Plastic Surgery

## 2023-08-27 VITALS — BP 122/83 | HR 80 | Ht 63.0 in | Wt 115.4 lb

## 2023-08-27 DIAGNOSIS — N6489 Other specified disorders of breast: Secondary | ICD-10-CM

## 2023-08-27 DIAGNOSIS — T8543XA Leakage of breast prosthesis and implant, initial encounter: Secondary | ICD-10-CM | POA: Diagnosis not present

## 2023-08-27 NOTE — Progress Notes (Signed)
   Subjective:    Patient ID: Alexa Proctor, female    DOB: 1955-12-05, 68 y.o.   MRN: [id number]  The patient is a 68 year old female here for evaluation of her breast.  She underwent placement of implants by Dr. Dub Amis approximately 30 years ago.  She is not sure what size the implants are but she is pretty sure they are under the muscle and that there is saline implants.  They were placed through a areolar incision.  She does not have diabetes and she quit smoking 4 years ago.  Her last mammogram was July 2023 so she is due for another one.  The left implant looks to be completely deflated.  The right 1 seems to be intact and doing well.  No masses or areas of concern noted.  The patient is 5 feet 3 inches tall weighs 115 pounds.     Review of Systems  Constitutional: Negative.   HENT: Negative.    Eyes: Negative.   Respiratory: Negative.    Cardiovascular: Negative.   Gastrointestinal: Negative.   Endocrine: Negative.   Genitourinary: Negative.   Musculoskeletal: Negative.        Objective:   Physical Exam Vitals and nursing note reviewed.  Constitutional:      Appearance: Normal appearance.  HENT:     Head: Normocephalic and atraumatic.  Cardiovascular:     Rate and Rhythm: Normal rate.     Pulses: Normal pulses.  Pulmonary:     Effort: Pulmonary effort is normal.  Abdominal:     Palpations: Abdomen is soft.  Musculoskeletal:        General: No swelling.  Skin:    General: Skin is warm.     Capillary Refill: Capillary refill takes less than 2 seconds.     Coloration: Skin is not jaundiced.     Findings: No bruising.  Neurological:     Mental Status: She is alert and oriented to person, place, and time.  Psychiatric:        Mood and Affect: Mood normal.        Behavior: Behavior normal.        Thought Content: Thought content normal.        Judgment: Judgment normal.       Assessment & Plan:     ICD-10-CM   1. Rupture of implant of left breast, initial  encounter  T85.43XA       The patient is a good candidate for removal and replacement of implants.  She does not want to go without any implants.  She would like to be around about the same size.  Since she does not know what size they are we will do a release of information and try and get this information from Dr. Dub Amis.  She will also come in and do some sizers so we can figure out what size implants to order for her.  We will plan on saline implants.  Which she is also okay with a inframammary incision and that is actually her preference.  Pictures were obtained of the patient and placed in the chart with the patient's or guardian's permission.   Pictures were obtained of the patient and placed in the chart with the patient's or guardian's permission.

## 2023-08-30 ENCOUNTER — Ambulatory Visit: Payer: Medicare Other | Admitting: Student

## 2023-08-30 DIAGNOSIS — N6489 Other specified disorders of breast: Secondary | ICD-10-CM

## 2023-09-07 NOTE — Addendum Note (Signed)
Addended by: Peggye Form on: 09/07/2023 03:22 PM   Modules accepted: Orders

## 2023-09-07 NOTE — Addendum Note (Signed)
Addended by: Peggye Form on: 09/07/2023 03:29 PM   Modules accepted: Orders

## 2023-09-08 ENCOUNTER — Ambulatory Visit: Payer: Medicare Other

## 2023-09-08 ENCOUNTER — Ambulatory Visit
Admission: RE | Admit: 2023-09-08 | Discharge: 2023-09-08 | Disposition: A | Discharge status: Home / Self Care | Payer: Medicare Other | Source: Ambulatory Visit | Attending: Plastic Surgery | Admitting: Plastic Surgery

## 2023-09-08 DIAGNOSIS — N6489 Other specified disorders of breast: Secondary | ICD-10-CM

## 2023-09-08 DIAGNOSIS — T8543XA Leakage of breast prosthesis and implant, initial encounter: Secondary | ICD-10-CM

## 2023-09-09 ENCOUNTER — Encounter: Payer: Self-pay | Admitting: Surgical

## 2023-09-09 ENCOUNTER — Ambulatory Visit (INDEPENDENT_AMBULATORY_CARE_PROVIDER_SITE_OTHER): Payer: Self-pay | Admitting: Surgical

## 2023-09-09 VITALS — BP 132/84 | HR 81 | Ht 63.0 in | Wt 114.0 lb

## 2023-09-09 DIAGNOSIS — T8543XA Leakage of breast prosthesis and implant, initial encounter: Secondary | ICD-10-CM

## 2023-09-09 DIAGNOSIS — N6489 Other specified disorders of breast: Secondary | ICD-10-CM

## 2023-09-09 MED ORDER — CEPHALEXIN 500 MG PO CAPS: 500.0000 mg | ORAL_CAPSULE | Freq: Four times a day (QID) | ORAL | 0 refills | Status: AC

## 2023-09-09 MED ORDER — ONDANSETRON HCL 4 MG PO TABS: 4.0000 mg | ORAL_TABLET | Freq: Three times a day (TID) | ORAL | 0 refills | Status: DC | PRN

## 2023-09-09 MED ORDER — TRAMADOL HCL 50 MG PO TABS: 50.0000 mg | ORAL_TABLET | Freq: Four times a day (QID) | ORAL | 0 refills | Status: AC | PRN

## 2023-09-09 NOTE — Progress Notes (Signed)
 Patient ID: Alexa Proctor, female    DOB: 1955-12-13, 68 y.o.   MRN: [id number]  Chief Complaint  Patient presents with   Pre-op Exam      ICD-10-CM   1. Rupture of implant of left breast, initial encounter  T85.43XA     2. Postoperative breast asymmetry  N64.89       History of Present Illness: Alexa Proctor is a 68 y.o.  female  with a history of breast implant placement/augmentation.  She presents for preoperative evaluation for upcoming procedure, bilateral breast implant exchange, scheduled for 09/27/2023 with Dr. Ulice Bold.  The patient has not had problems with anesthesia. No history of DVT/PE.  No family history of DVT/PE.  No family or personal history of bleeding or clotting disorders.  Patient is not currently taking any blood thinners.  No history of CVA/MI.   Summary of Previous Visit: Patient underwent implant placement by Dr. Dub Amis 30 years ago, she is not sure what size the implants are, but believes they are under the muscle and saline.  They replaced doing areolar incision.  Non-smoker, nondiabetic.  Mammogram July 20 23rd, due for another one.  Left implant seems to be deflated, right 1 seems to be intact.  PMH Significant for: Hyperlipidemia, restless leg syndrome, history of breast augmentation about 30 years ago  Patient had mammogram yesterday 09/08/2023, BI-RADS 1.  Nonvisualization of the left saline implant, right saline implant intact.  No evidence of malignancy  Patient would like saline implants**.  She likes 150 cc size, but would be comfortable with anything between 125 to 175 cc depending on symmetry and match.  She would like to be about the same size that she has now on the right side.  She had a implant fitting appointment with RN here at our office and this information was relayed to me via RN and confirmed by the patient.   Past Medical History: Allergies: Allergies  Allergen Reactions   Soma [Carisoprodol]     Made patient feel  "loopy, weird"   Sulfonamide Derivatives     Per patient happened in childhood, does not know reaction.    Current Medications:  Current Outpatient Medications:    buPROPion (WELLBUTRIN XL) 300 MG 24 hr tablet, TAKE 1 TABLET BY MOUTH EVERY DAY, Disp: 90 tablet, Rfl: 1   cephALEXin (KEFLEX) 500 MG capsule, Take 1 capsule (500 mg total) by mouth 4 (four) times daily for 5 days., Disp: 20 capsule, Rfl: 0   cetirizine (ZYRTEC) 10 MG tablet, Take 10 mg by mouth daily., Disp: , Rfl:    Multiple Vitamin (MULTIVITAMIN) tablet, Take 1 tablet by mouth daily., Disp: , Rfl:    NON FORMULARY, CBD oil, Disp: , Rfl:    ondansetron (ZOFRAN) 4 MG tablet, Take 1 tablet (4 mg total) by mouth every 8 (eight) hours as needed for nausea or vomiting., Disp: 20 tablet, Rfl: 0   traMADol (ULTRAM) 50 MG tablet, Take 1 tablet (50 mg total) by mouth every 6 (six) hours as needed for up to 5 days., Disp: 20 tablet, Rfl: 0  Past Medical Problems: Past Medical History:  Diagnosis Date   Depression    Groin mass in female    Osteoporosis     Past Surgical History: Past Surgical History:  Procedure Laterality Date   BREAST SURGERY  03/1992   implants   HERNIA REPAIR     right groin mass  03/24/12   SHOULDER ARTHROSCOPY WITH ROTATOR CUFF REPAIR AND OPEN  BICEPS TENODESIS Right 04/25/2020   Procedure: RIGHT SHOULDER ARTHROSCOPY, DEBRIDEMENT, MINI OPEN ROTATOR CUFF TEAR REPAIR AND OPEN BICEPS TENODESIS;  Surgeon: Cammy Copa, MD;  Location: [facility];  Service: Orthopedics;  Laterality: Right;   TUBAL LIGATION  08/1990    Social History: Social History   Socioeconomic History   Marital status: Married    Spouse name: Not on file   Number of children: Not on file   Years of education: Not on file   Highest education level: 12th grade  Occupational History   Not on file  Tobacco Use   Smoking status: Former    Types: Cigarettes   Smokeless tobacco: Never  Vaping Use   Vaping status:  Every Day   Start date: 12/26/2018  Substance and Sexual Activity   Alcohol use: Not Currently   Drug use: No   Sexual activity: Not Currently  Other Topics Concern   Not on file  Social History Narrative   Not on file   Social Drivers of Health   Financial Resource Strain: Low Risk  (02/02/2023)   Overall Financial Resource Strain (CARDIA)    Difficulty of Paying Living Expenses: Not hard at all  Food Insecurity: No Food Insecurity (02/02/2023)   Hunger Vital Sign    Worried About Running Out of Food in the Last Year: Never true    Ran Out of Food in the Last Year: Never true  Transportation Needs: No Transportation Needs (02/02/2023)   PRAPARE - Administrator, Civil Service (Medical): No    Lack of Transportation (Non-Medical): No  Physical Activity: Inactive (02/02/2023)   Exercise Vital Sign    Days of Exercise per Week: 0 days    Minutes of Exercise per Session: 0 min  Stress: Stress Concern Present (02/02/2023)   [employer] of Occupational Health - Occupational Stress Questionnaire    Feeling of Stress : To some extent  Social Connections: Moderately Isolated (02/02/2023)   Social Connection and Isolation Panel [NHANES]    Frequency of Communication with Friends and Family: Three times a week    Frequency of Social Gatherings with Friends and Family: Once a week    Attends Religious Services: Never    Database administrator or Organizations: No    Attends Banker Meetings: Never    Marital Status: Married  Catering manager Violence: Not At Risk (01/27/2023)   Humiliation, Afraid, Rape, and Kick questionnaire    Fear of Current or Ex-Partner: No    Emotionally Abused: No    Physically Abused: No    Sexually Abused: No    Family History: Family History  Problem Relation Age of Onset   Cancer Mother        breast   Hypertension Father    Cancer Father        pancreatic   Cancer Sister        colon, lung   Breast cancer Neg Hx     Review  of Systems: Review of Systems  Constitutional: Negative.   Respiratory: Negative.    Cardiovascular: Negative.   Neurological: Negative.     Physical Exam: Vital Signs BP 132/84 (BP Location: Left Arm, Patient Position: Sitting, Cuff Size: Normal)   Pulse 81   Ht 5\' 3"  (1.6 m)   Wt 114 lb (51.7 kg)   SpO2 98%   BMI 20.19 kg/m   Physical Exam Constitutional:      General: Not in acute distress.  Appearance: Normal appearance. Not ill-appearing.  HENT:     Head: Normocephalic and atraumatic.  Eyes:     Pupils: Pupils are equal, round Neck:     Musculoskeletal: Normal range of motion.  Cardiovascular:     Rate and Rhythm: Normal rate    Pulses: Normal pulses.  Pulmonary:     Effort: Pulmonary effort is normal. No respiratory distress.  Abdominal:     General: Abdomen is flat. There is no distension.  Musculoskeletal: Normal range of motion.  Skin:    General: Skin is warm and dry.     Findings: No erythema or rash.  Neurological:     General: No focal deficit present.     Mental Status: Alert and oriented to person, place, and time. Mental status is at baseline.     Motor: No weakness.  Psychiatric:        Mood and Affect: Mood normal.        Behavior: Behavior normal.    Assessment/Plan: The patient is scheduled for bilateral implant exchange with Dr. Ulice Bold.  Risks, benefits, and alternatives of procedure discussed, questions answered and consent obtained.    Smoking Status: Quit smoking about 4-1/2 years ago; Counseling Given?  N/A Last Mammogram: Mammogram yesterday, negative  Caprini Score: 4, moderate; Risk Factors include: Age and length of planned surgery. Recommendation for mechanical prophylaxis. Encourage early ambulation.   Pictures obtained: @consult   Post-op Rx sent to pharmacy:  Tramadol, Zofran, Keflex  Patient was provided with the General Surgical Risk consent document and Pain Medication Agreement prior to their appointment.  They had  adequate time to read through the risk consent documents and Pain Medication Agreement. We also discussed them in person together during this preop appointment. All of their questions were answered to their satisfaction.  Recommended calling if they have any further questions.  Risk consent form and Pain Medication Agreement to be scanned into patient's chart.  Patient was provided with the Mentor implant patient decision checklist and this was completed during today's preoperative evaluation. Patient had time to read through the information and any questions were answered to their content. Form will be scanned into patient's chart.  The risks that can be encountered with and after placement of a breast implant were discussed and include the following but not limited to these: bleeding, infection, delayed healing, anesthesia risks, skin sensation changes, injury to structures including nerves, blood vessels, and muscles which may be temporary or permanent, allergies to tape, suture materials and glues, blood products, topical preparations or injected agents, skin contour irregularities, skin discoloration and swelling, deep vein thrombosis, cardiac and pulmonary complications, pain, which may persist, fluid accumulation, wrinkling of the skin over the implanmt, changes in nipple or breast sensation, implant leakage or rupture, faulty position of the implant, persistent pain, formation of tight scar tissue around the implant (capsular contracture).   Electronically signed by: Kermit Balo Cleston Lautner, PA-C 09/09/2023 12:53 PM

## 2023-09-09 NOTE — Progress Notes (Signed)
Error

## 2023-09-27 DIAGNOSIS — Z411 Encounter for cosmetic surgery: Secondary | ICD-10-CM

## 2023-10-05 ENCOUNTER — Encounter: Payer: Self-pay | Admitting: Plastic Surgery

## 2023-10-05 ENCOUNTER — Ambulatory Visit (INDEPENDENT_AMBULATORY_CARE_PROVIDER_SITE_OTHER): Payer: Self-pay | Admitting: Plastic Surgery

## 2023-10-05 VITALS — BP 124/78 | HR 74

## 2023-10-05 DIAGNOSIS — N6489 Other specified disorders of breast: Secondary | ICD-10-CM

## 2023-10-05 DIAGNOSIS — Z9889 Other specified postprocedural states: Secondary | ICD-10-CM

## 2023-10-05 DIAGNOSIS — T8543XA Leakage of breast prosthesis and implant, initial encounter: Secondary | ICD-10-CM

## 2023-10-05 NOTE — Progress Notes (Signed)
   Subjective:    Patient ID: Alexa Proctor, female    DOB: February 03, 1956, 69 y.o.   MRN: [id number]  The patient is a 68 year old female here for follow-up after undergoing surgery last week.  She had some issues with her previously placed implants from 30 years ago so underwent surgical treatment for removal and replacement.  She is 5 feet 3 inches tall weighs around 115 pounds.  The incisions are healing nicely.  There is no sign of infection or seroma.  Tacking the implants more medially seems to have helped quite a bit with the positioning of the implants.      Review of Systems  Constitutional: Negative.   Eyes: Negative.   Respiratory: Negative.    Cardiovascular: Negative.   Endocrine: Negative.        Objective:   Physical Exam Cardiovascular:     Rate and Rhythm: Normal rate.     Pulses: Normal pulses.  Neurological:     Mental Status: She is oriented to person, place, and time.  Psychiatric:        Mood and Affect: Mood normal.        Behavior: Behavior normal.        Thought Content: Thought content normal.        Judgment: Judgment normal.       Assessment & Plan:     ICD-10-CM   1. Postoperative breast asymmetry  N64.89     2. Rupture of implant of left breast, initial encounter  T85.43XA       Follow-up in 1 to 2 weeks.  Continue with sports bra.  Be really careful about not having any pressure on the breast area because of the lateral sutures that are healing.  Pictures were obtained of the patient and placed in the chart with the patient's or guardian's permission.

## 2023-10-16 ENCOUNTER — Other Ambulatory Visit: Payer: Self-pay | Admitting: Family Medicine

## 2023-10-18 NOTE — Progress Notes (Unsigned)
 68 year old female here for follow-up after undergoing removal and replacement of breast implants on 09/27/2023 with Dr. Ulice Bold.  She had 210 to 250 cc saline implants placed, a fill volume of 250 cc.  Mentor high-profile saline implants. She reports she is overall doing well, feels that she is recovering well.  Pain has been well-controlled with ibuprofen only.  She is not having any infectious symptoms.  She is overall pleased with her result, does have some question of the left breast being slightly higher.  Chaperone present on exam On exam bilateral mammary fold incisions are intact and healing well.  Monocryl suture knots are noted.  There is no subcutaneous fluid collection noted palpation of either breast.  There is no erythema or scaly changes of either breast.  She does have some upper pole fullness greater on the left than the right. Breasts are otherwise fairly symmetric.  A/P:  Patient is overall doing well after breast implant exchange.  No signs infection or concern on exam.  Recommend continuing with compressive garments, avoiding strenuous activities or heavy lifting until 6 weeks postop.  She did have some questions about procedure price, reports she received an additional bill from the surgical center.  We discussed that I would look into her cosmetic quote.  After her appointment I called the patient to confirm that the cosmetic quote based on the numbers I have available is accurate, but encouraged her to call to find out more information.  Recommend following up in a few weeks for reevaluation.  Call with questions or concerns.

## 2023-10-19 ENCOUNTER — Ambulatory Visit (INDEPENDENT_AMBULATORY_CARE_PROVIDER_SITE_OTHER): Payer: Self-pay | Admitting: Surgical

## 2023-10-19 VITALS — BP 108/70 | HR 70

## 2023-10-19 DIAGNOSIS — T8543XA Leakage of breast prosthesis and implant, initial encounter: Secondary | ICD-10-CM

## 2023-10-19 DIAGNOSIS — N6489 Other specified disorders of breast: Secondary | ICD-10-CM

## 2023-10-28 NOTE — Progress Notes (Unsigned)
 68 year old female here for follow-up after undergoing removal and replacement of breast implants on 09/27/2023 with Dr. Ulice Bold.    She had 210 to 250 cc saline implants with prescribed fill volume of 250 cc placed.  Mentor high-profile saline.  [name] reports she is doing really well, she does not have any specific questions or concerns.   Chaperone present on exam On exam bilateral NAC's are viable, bilateral breast discharge tact and well-healed along the IMF. There is no subcutaneous fluid collection.  No erythema or scaly changes noted.  Bilateral breasts are overall symmetric, left breast does have some increased superior pole volume compared to the right.  A/P:  Patient is doing well after implant exchange, no signs infection or concern on exam.  We discussed ongoing restrictions for 2 more weeks, continue with compressive garments 24/7.  Avoid heavy lifting.  Recommend following up as needed, call with questions or concerns.  Pictures were taken and placed in chart with patient's permission.  Discussed with patient recommendations for continuing with yearly mammograms.  In regards to implants, recommend initial imaging 5 to 6 years after placement or unless she notices changes.

## 2023-10-29 ENCOUNTER — Ambulatory Visit (INDEPENDENT_AMBULATORY_CARE_PROVIDER_SITE_OTHER): Payer: Medicare Other | Admitting: Surgical

## 2023-10-29 ENCOUNTER — Encounter: Payer: Self-pay | Admitting: Surgical

## 2023-10-29 VITALS — BP 116/72 | HR 69 | Ht 63.0 in | Wt 115.4 lb

## 2023-10-29 DIAGNOSIS — N6489 Other specified disorders of breast: Secondary | ICD-10-CM

## 2023-10-29 DIAGNOSIS — T8543XA Leakage of breast prosthesis and implant, initial encounter: Secondary | ICD-10-CM

## 2023-12-07 ENCOUNTER — Ambulatory Visit
Admission: EM | Admit: 2023-12-07 | Discharge: 2023-12-07 | Disposition: A | Discharge status: Home / Self Care | Attending: Family Medicine | Admitting: Family Medicine

## 2023-12-07 ENCOUNTER — Other Ambulatory Visit: Payer: Self-pay

## 2023-12-07 DIAGNOSIS — N3001 Acute cystitis with hematuria: Secondary | ICD-10-CM | POA: Diagnosis not present

## 2023-12-07 LAB — POCT URINALYSIS DIP (MANUAL ENTRY)
Bilirubin, UA: NEGATIVE
Glucose, UA: NEGATIVE mg/dL
Ketones, POC UA: NEGATIVE mg/dL
Nitrite, UA: NEGATIVE
Protein Ur, POC: NEGATIVE mg/dL
Spec Grav, UA: 1.02 (ref 1.010–1.025)
Urobilinogen, UA: 0.2 U/dL
pH, UA: 5.5 (ref 5.0–8.0)

## 2023-12-07 MED ORDER — CEPHALEXIN 500 MG PO CAPS: 500.0000 mg | ORAL_CAPSULE | Freq: Two times a day (BID) | ORAL | 0 refills | Status: AC

## 2023-12-07 NOTE — Discharge Instructions (Addendum)
 The clinic will contact you with results of the urine culture done today if positive.  Start Keflex  twice daily for 7 days.  Lots of rest and fluids.  Please follow-up with your PCP if your symptoms do not improve.  Please go to the ER if you develop any worsening symptoms.  Hope you feel better soon!

## 2023-12-07 NOTE — ED Provider Notes (Addendum)
 [facility]    CSN: [id number] Arrival date & time: 12/07/23  1454      History   Chief Complaint No chief complaint on file.   HPI Alexa Proctor is a 68 y.o. female presents for dysuria.  Patient reports 3 days of intermittent urinary burning, urgency, frequency.  Denies hematuria, fevers, nausea/vomiting, flank pain.  No vaginal discharge or STD concern.  Does notice that her symptoms seem to come on when she drinks coffee which she states she has been drinking for years.  She is been taking over-the-counter UTI treatment medications.  No other concerns at this time.  HPI  Past Medical History:  Diagnosis Date   Depression    Groin mass in female    Osteoporosis     Patient Active Problem List   Diagnosis Date Noted   Ruptured left breast implant 08/27/2023   Postoperative breast asymmetry 08/27/2023   Hyperlipidemia 01/12/2020   Groin mass in female 01/26/2012   Physical exam 04/13/2011   Screening for malignant neoplasm of cervix 04/13/2011   RLS (restless legs syndrome) 04/13/2011   Breast cancer screening by mammogram 04/13/2011   PETECHIAE 06/18/2009   Anxiety and depression 10/10/2008   Osteoporosis 10/10/2008    Past Surgical History:  Procedure Laterality Date   BREAST SURGERY  03/1992   implants   HERNIA REPAIR     right groin mass  03/24/12   SHOULDER ARTHROSCOPY WITH ROTATOR CUFF REPAIR AND OPEN BICEPS TENODESIS Right 04/25/2020   Procedure: RIGHT SHOULDER ARTHROSCOPY, DEBRIDEMENT, MINI OPEN ROTATOR CUFF TEAR REPAIR AND OPEN BICEPS TENODESIS;  Surgeon: Jasmine Mesi, MD;  Location: [facility];  Service: Orthopedics;  Laterality: Right;   TUBAL LIGATION  08/1990    OB History   No obstetric history on file.      Home Medications    Prior to Admission medications   Medication Sig Start Date End Date Taking? Authorizing Provider  cephALEXin  (KEFLEX ) 500 MG capsule Take 1 capsule (500 mg total) by mouth 2 (two) times  daily for 7 days. 12/07/23 12/14/23 Yes Artist Bloom, Jodi R, NP  buPROPion  (WELLBUTRIN  XL) 300 MG 24 hr tablet TAKE 1 TABLET BY MOUTH EVERY DAY 10/18/23   Tabori, Katherine E, MD  cetirizine (ZYRTEC) 10 MG tablet Take 10 mg by mouth daily.    [provider]  Multiple Vitamin (MULTIVITAMIN) tablet Take 1 tablet by mouth daily.    [provider]  NON FORMULARY CBD oil    [provider]    Family History Family History  Problem Relation Age of Onset   Cancer Mother        breast   Hypertension Father    Cancer Father        pancreatic   Cancer Sister        colon, lung   Breast cancer Neg Hx     Social History Social History   Tobacco Use   Smoking status: Former    Types: Cigarettes   Smokeless tobacco: Never  Vaping Use   Vaping status: Every Day   Start date: 12/26/2018  Substance Use Topics   Alcohol use: Not Currently   Drug use: No     Allergies   Soma [carisoprodol] and Sulfonamide derivatives   Review of Systems Review of Systems  Genitourinary:  Positive for dysuria.     Physical Exam Triage Vital Signs ED Triage Vitals  Encounter Vitals Group     BP 12/07/23 1617 137/88  Systolic BP Percentile --      Diastolic BP Percentile --      Pulse Rate 12/07/23 1617 64     Resp 12/07/23 1617 16     Temp 12/07/23 1617 98.7 F (37.1 C)     Temp Source 12/07/23 1617 Oral     SpO2 12/07/23 1617 94 %     Weight --      Height --      Head Circumference --      Peak Flow --      Pain Score 12/07/23 1615 0     Pain Loc --      Pain Education --      Exclude from Growth Chart --    No data found.  Updated Vital Signs BP 137/88   Pulse 64   Temp 98.7 F (37.1 C) (Oral)   Resp 16   SpO2 94%   Visual Acuity Right Eye Distance:   Left Eye Distance:   Bilateral Distance:    Right Eye Near:   Left Eye Near:    Bilateral Near:     Physical Exam Vitals and nursing note reviewed.  Constitutional:      Appearance: Normal  appearance.  HENT:     Head: Normocephalic and atraumatic.  Eyes:     Pupils: Pupils are equal, round, and reactive to light.  Cardiovascular:     Rate and Rhythm: Normal rate.  Pulmonary:     Effort: Pulmonary effort is normal.  Abdominal:     Tenderness: There is no right CVA tenderness or left CVA tenderness.  Skin:    General: Skin is warm and dry.  Neurological:     General: No focal deficit present.     Mental Status: She is alert and oriented to person, place, and time.  Psychiatric:        Mood and Affect: Mood normal.        Behavior: Behavior normal.      UC Treatments / Results  Labs (all labs ordered are listed, but only abnormal results are displayed) Labs Reviewed  POCT URINALYSIS DIP (MANUAL ENTRY) - Abnormal; Notable for the following components:      Result Value   Blood, UA moderate (*)    Leukocytes, UA Trace (*)    All other components within normal limits  URINE CULTURE   Basic metabolic panel Order: [id number]  Status: Final result     Next appt: None     Dx: Hyperlipidemia, unspecified hyperlipi...   Test Result Released: Yes (seen)     Messages: Seen   1 Result Note     1 Patient Communication     View Follow-Up Encounter          Component Ref Range & Units (hover) 10 mo ago (02/05/23) 1 yr ago (01/19/22) 2 yr ago (01/16/21) 3 yr ago (04/24/20) 3 yr ago (01/12/20) 4 yr ago (01/11/19) 6 yr ago (10/28/17)  Glucose, Bld 94 101 High  R 83 R 96 R, CM 95 CM 81 R 89 R  Comment: .            Fasting reference interval .  BUN 18 16 R 22 R 13 R 14 15 R 14 R  Creat 1.03 0.95 R 0.95 R 0.99 R 0.94 R, CM 0.87 R 0.90 R  BUN/Creatinine Ratio SEE NOTE:    NOT APPLICABLE    Comment:    Not Reported: BUN and Creatinine are within  reference range. .  Sodium 140 140 R 139 R 140 R 140 141 R 140 R  Potassium 5.0 4.0 R 4.3 R 3.9 R 4.2 4.2 R 4.2 R  Chloride 103 103 R 103 R 105 R 105 104 R 105 R  CO2 29 29 R 27 R 26 R 27 27 R 28 R  Calcium 10.3 10.0 R  9.4 R 9.4 R 9.7 9.7 R 9.8 R  Resulting Agency [facility] [place] [facility] [place] [facility]         Resulting Agency's Comment  Performing Organization Information:     Site IDLoreta Rome (CLIA: [id number])     Name: [facility]     Address: [address] [place], [place] [address]     Director: Pleas Brill MD  Specimen Collected: 02/05/23 14:48 Last Resulted: 02/06/23 03:05   EKG   Radiology No results found.  Procedures Procedures (including critical care time)  Medications Ordered in UC Medications - No data to display  Initial Impression / Assessment and Plan / UC Course  I have reviewed the triage vital signs and the nursing notes.  Pertinent labs & imaging results that were available during my care of the patient were reviewed by me and considered in my medical decision making (see chart for details).     Reviewed exam and symptoms with patient.  No red flags.  Urine with blood and trace leuks.  Will send urine culture and contact for any positive results.  Will start Keflex  to cover potential UTI but did discuss with patient potential interstitial cystitis given trigger is coffee.  Advise lots of rest and fluids and follow-up with PCP if symptoms do not improve.  ER precautions reviewed and patient verbalized understanding. Final Clinical Impressions(s) / UC Diagnoses   Final diagnoses:  Acute cystitis with hematuria     Discharge Instructions      The clinic will contact you with results of the urine culture done today if positive.  Start Keflex  twice daily for 7 days.  Lots of rest and fluids.  Please follow-up with your PCP if your symptoms do not improve.  Please go to the ER if you develop any worsening symptoms.  Hope you feel better soon!  ED Prescriptions     Medication Sig Dispense Auth. Provider   cephALEXin  (KEFLEX ) 500 MG capsule Take 1 capsule (500 mg total) by  mouth 2 (two) times daily for 7 days. 14 capsule Pinky Ravan, Jodi R, NP      PDMP not reviewed this encounter.   Alleen Arbour, NP 12/07/23 1639    Alleen Arbour, NP 12/07/23 1640

## 2023-12-07 NOTE — ED Triage Notes (Signed)
 Pt c/o urinary urgency, discomfort w/urinationx3d.

## 2023-12-10 ENCOUNTER — Ambulatory Visit (HOSPITAL_COMMUNITY): Payer: Self-pay

## 2023-12-10 LAB — URINE CULTURE: Culture: 60000 — AB

## 2024-04-13 ENCOUNTER — Other Ambulatory Visit: Payer: Self-pay | Admitting: Family Medicine

## 2024-04-13 NOTE — Telephone Encounter (Signed)
 Last office visit 02/05/2023 Needs appt.  Will call today

## 2024-04-21 ENCOUNTER — Ambulatory Visit (INDEPENDENT_AMBULATORY_CARE_PROVIDER_SITE_OTHER): Admitting: Family Medicine

## 2024-04-21 ENCOUNTER — Encounter: Payer: Self-pay | Admitting: Family Medicine

## 2024-04-21 VITALS — BP 120/64 | HR 98 | Temp 98.1°F | Ht 63.0 in | Wt 113.5 lb

## 2024-04-21 DIAGNOSIS — F419 Anxiety disorder, unspecified: Secondary | ICD-10-CM

## 2024-04-21 DIAGNOSIS — F32A Depression, unspecified: Secondary | ICD-10-CM | POA: Diagnosis not present

## 2024-04-21 NOTE — Assessment & Plan Note (Signed)
 Deteriorated.  Pt has had a difficult year w/ her daughter.  She reports they were very close but last October they had a disagreement that the pt thought was small and insignificant but has led to a very rocky year between them.  Feels like she's always walking on eggshells and is emotionally exhausted.  She is not interested in adjusting medication at this time but does think it would be helpful to start counseling.  Referral placed.  I personally spent a total of 28 minutes in the care of the patient today including counseling and educating.

## 2024-04-21 NOTE — Progress Notes (Signed)
   Subjective:    Patient ID: Alexa Proctor, female    DOB: Jan 06, 1956, 69 y.o.   MRN: [id number]  HPI Anxiety- chronic problem, currently on Wellbutrin  300mg  daily.  Reports she is having a lot of conflict w/ her daughter that has been ongoing for the last year.  There have been multiple misunderstandings via text.  Feels that she's walking on eggshells to maintain a relationship.  Relationship is like a roller coaster at this point- ups and downs- and she never knows what she's going to get.   Review of Systems For ROS see HPI     Objective:   Physical Exam Vitals reviewed.  Constitutional:      General: She is not in acute distress.    Appearance: Normal appearance. She is not ill-appearing.  HENT:     Head: Normocephalic and atraumatic.  Eyes:     Extraocular Movements: Extraocular movements intact.     Conjunctiva/sclera: Conjunctivae normal.  Skin:    General: Skin is warm and dry.  Neurological:     General: No focal deficit present.     Mental Status: She is alert and oriented to person, place, and time.  Psychiatric:        Behavior: Behavior normal.        Thought Content: Thought content normal.     Comments: anxious           Assessment & Plan:

## 2024-04-21 NOTE — Patient Instructions (Signed)
 Follow up in 3 months to recheck mood We'll call you to schedule your counseling appt Make sure you are protecting your peace- taking time for yourself, setting boundaries, taking things in small pieces to better handle them Call with any questions or concerns Hang in there!

## 2024-06-14 ENCOUNTER — Ambulatory Visit

## 2024-06-28 ENCOUNTER — Ambulatory Visit

## 2024-07-24 ENCOUNTER — Ambulatory Visit (INDEPENDENT_AMBULATORY_CARE_PROVIDER_SITE_OTHER): Admitting: Family Medicine

## 2024-07-24 ENCOUNTER — Encounter: Payer: Self-pay | Admitting: Family Medicine

## 2024-07-24 VITALS — BP 118/78 | HR 87 | Temp 98.6°F | Ht 63.0 in | Wt 117.0 lb

## 2024-07-24 DIAGNOSIS — F32A Depression, unspecified: Secondary | ICD-10-CM | POA: Diagnosis not present

## 2024-07-24 DIAGNOSIS — F419 Anxiety disorder, unspecified: Secondary | ICD-10-CM | POA: Diagnosis not present

## 2024-07-24 NOTE — Patient Instructions (Signed)
 Follow up in 6 months to recheck cholesterol No med changes at this time Schedule with the counselor at your convenience to help find a way forward in your relationship I'm so glad that you are able to reflect and grow your relationship w/ Bernardino Call with any questions or concerns Stay Safe!  Stay Healthy! Happy New Year!

## 2024-07-24 NOTE — Progress Notes (Signed)
" ° °  Subjective:    Patient ID: Alexa Proctor, female    DOB: 03-06-1956, 68 y.o.   MRN: [id number]  HPI Anxiety and depression- ongoing issue.  Currently on Wellbutrin  XL 300mg  daily.  Pt reports feeling somewhat better.  Anxiety is 'a little less'.  Has not yet scheduled w/ Counselor but intends to.  Daughter is starting to make pt feel guilty about spending time w/ her son and his girlfriend.  Daughter is jealous of relationship w/ the girlfriend.   Review of Systems For ROS see HPI     Objective:   Physical Exam Vitals reviewed.  Constitutional:      General: She is not in acute distress.    Appearance: Normal appearance. She is not ill-appearing.  HENT:     Head: Normocephalic and atraumatic.  Eyes:     Extraocular Movements: Extraocular movements intact.     Conjunctiva/sclera: Conjunctivae normal.  Cardiovascular:     Rate and Rhythm: Normal rate.  Pulmonary:     Effort: Pulmonary effort is normal. No respiratory distress.  Skin:    General: Skin is warm and dry.  Neurological:     General: No focal deficit present.     Mental Status: She is alert and oriented to person, place, and time.  Psychiatric:        Mood and Affect: Mood normal.        Behavior: Behavior normal.        Thought Content: Thought content normal.           Assessment & Plan:    "

## 2024-07-24 NOTE — Assessment & Plan Note (Signed)
 Ongoing issue.  Pt feels that things are improving as she is able to self reflect on her relationships w/ each of her kids and grow from it.  She still plans to schedule with the counselor but things have been very busy with the holidays.  No med changes at this time.  Will follow.

## 2024-08-03 ENCOUNTER — Encounter: Payer: Self-pay | Admitting: Family Medicine

## 2024-08-04 MED ORDER — BUPROPION HCL ER (XL) 300 MG PO TB24: 300.0000 mg | ORAL_TABLET | Freq: Every day | ORAL | 1 refills | Status: AC

## 2025-01-22 ENCOUNTER — Ambulatory Visit: Admitting: Family Medicine
